# Patient Record
Sex: Female | Born: 2000 | Race: Black or African American | Hispanic: No | Marital: Single | Smoking: Never smoker
Health system: Southern US, Community
[De-identification: ages and names within clinical notes are randomized; demographics above are authoritative.]

## PROBLEM LIST (undated history)

## (undated) ENCOUNTER — Ambulatory Visit (HOSPITAL_COMMUNITY): Payer: BC Managed Care – PPO

## (undated) DIAGNOSIS — I639 Cerebral infarction, unspecified: Secondary | ICD-10-CM

## (undated) DIAGNOSIS — Q200 Common arterial trunk: Secondary | ICD-10-CM

## (undated) HISTORY — PX: AORTIC VALVE REPLACEMENT: SHX41

## (undated) HISTORY — PX: TRUNCUS ARTERIOSUS REPAIR: SHX2583

## (undated) HISTORY — PX: CARDIAC DEFIBRILLATOR PLACEMENT: SHX171

## (undated) HISTORY — DX: Cerebral infarction, unspecified: I63.9

## (undated) HISTORY — PX: CARDIAC VALVE REPLACEMENT: SHX585

---
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
## 2022-01-30 DIAGNOSIS — F411 Generalized anxiety disorder: Secondary | ICD-10-CM | POA: Diagnosis not present

## 2022-01-30 DIAGNOSIS — F330 Major depressive disorder, recurrent, mild: Secondary | ICD-10-CM | POA: Diagnosis not present

## 2022-02-03 DIAGNOSIS — Z86718 Personal history of other venous thrombosis and embolism: Secondary | ICD-10-CM | POA: Diagnosis not present

## 2022-02-03 DIAGNOSIS — Z952 Presence of prosthetic heart valve: Secondary | ICD-10-CM | POA: Diagnosis not present

## 2022-02-10 DIAGNOSIS — Z7901 Long term (current) use of anticoagulants: Secondary | ICD-10-CM | POA: Diagnosis not present

## 2022-02-10 DIAGNOSIS — R2241 Localized swelling, mass and lump, right lower limb: Secondary | ICD-10-CM | POA: Diagnosis not present

## 2022-02-10 DIAGNOSIS — Z7982 Long term (current) use of aspirin: Secondary | ICD-10-CM | POA: Diagnosis not present

## 2022-02-10 DIAGNOSIS — D1723 Benign lipomatous neoplasm of skin and subcutaneous tissue of right leg: Secondary | ICD-10-CM | POA: Diagnosis not present

## 2022-02-19 DIAGNOSIS — D1723 Benign lipomatous neoplasm of skin and subcutaneous tissue of right leg: Secondary | ICD-10-CM | POA: Diagnosis not present

## 2022-03-05 DIAGNOSIS — F411 Generalized anxiety disorder: Secondary | ICD-10-CM | POA: Diagnosis not present

## 2022-03-05 DIAGNOSIS — F330 Major depressive disorder, recurrent, mild: Secondary | ICD-10-CM | POA: Diagnosis not present

## 2022-03-10 IMAGING — CT CT ABD-PELV W/O CM
2 of 4 series · 14 of 46 positions shown, 16 images · non-contrast
Comparison: Pelvic ultrasound, dated October 07, 2020
COMPARISON: Pelvic ultrasound, dated October 07, 2020

Addendum:
CLINICAL DATA: Abdominal pain and vomiting.

EXAM:
CT ABDOMEN AND PELVIS WITHOUT CONTRAST
TECHNIQUE: Multidetector CT imaging of the abdomen and pelvis was performed
following the standard protocol without IV contrast.

[Series 3: abd/ pelvis 5.0 i30f 2 · axial · 0.59mm/px · z∈[+906,+1251]mm · 11 of 81 slices shown, 13 images]
[im 6/81  soft-tissue]
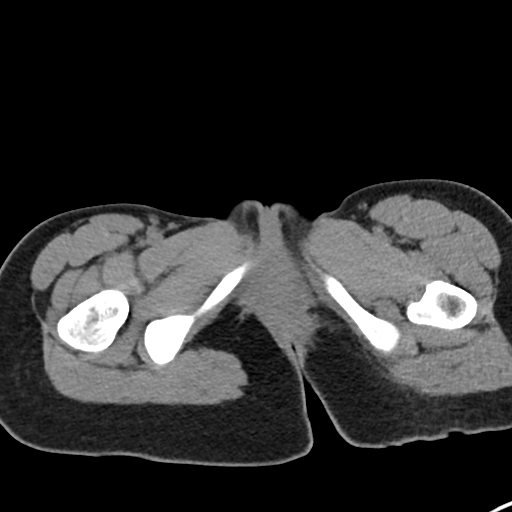
[im 6/81  bone]
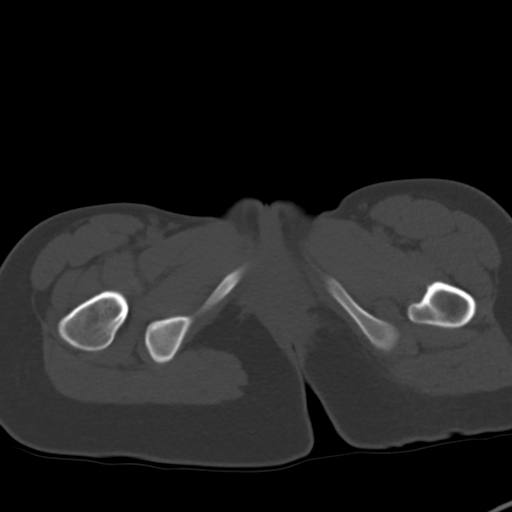
[im 12/81  soft-tissue]
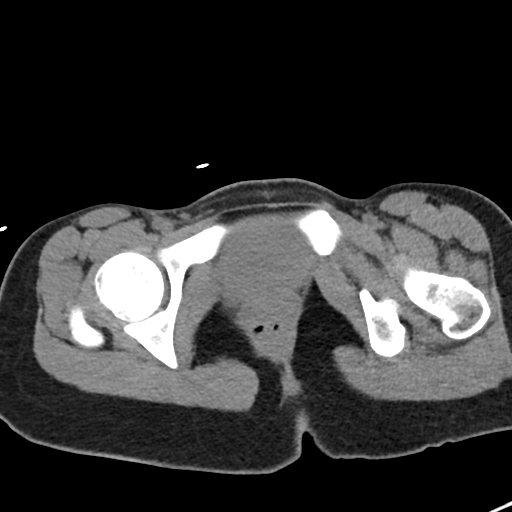
[im 18/81  soft-tissue]
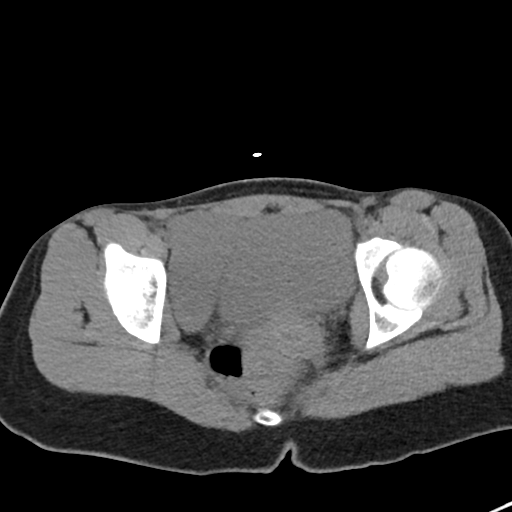
[im 27/81  soft-tissue]
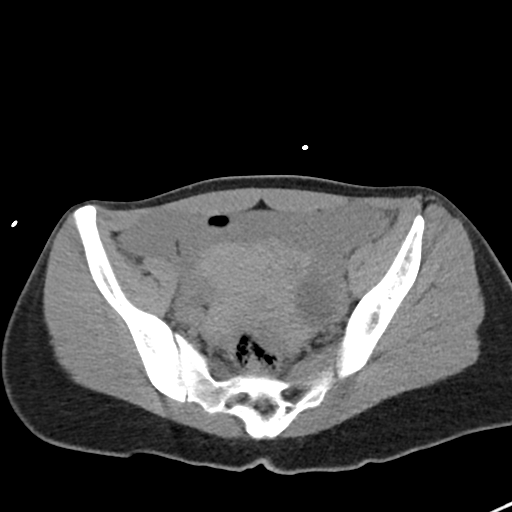
[im 33/81  soft-tissue]
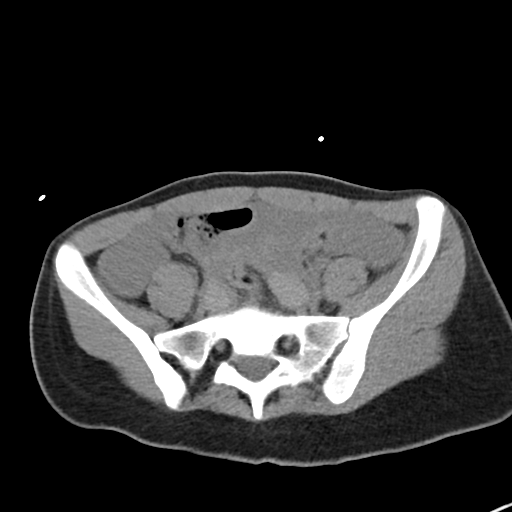
[im 42/81  soft-tissue]
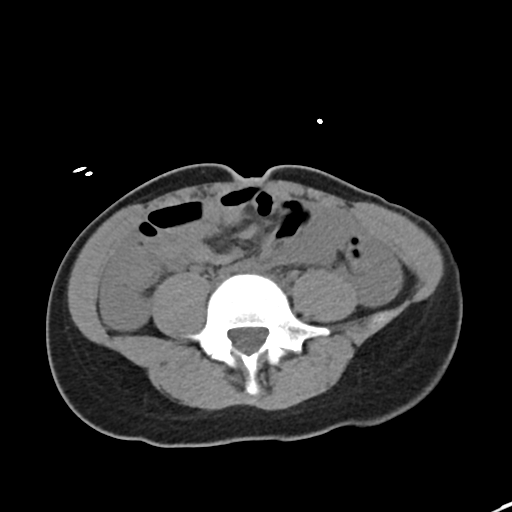
[im 48/81  soft-tissue]
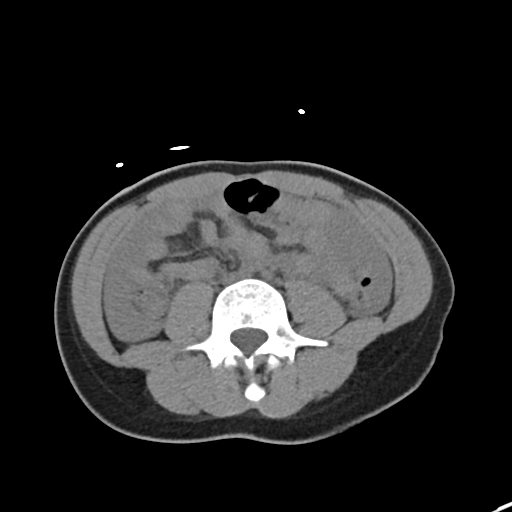
[im 54/81  soft-tissue]
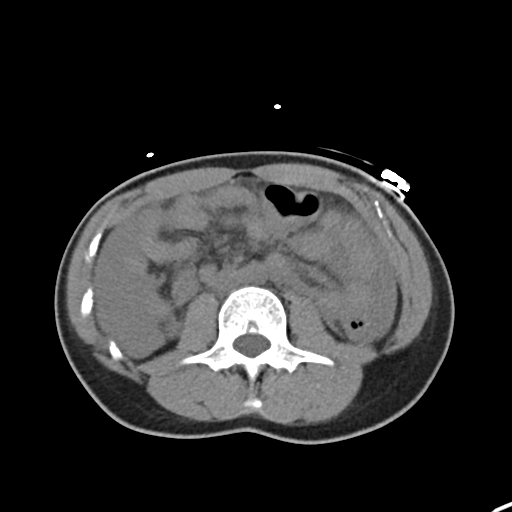
[im 63/81  soft-tissue]
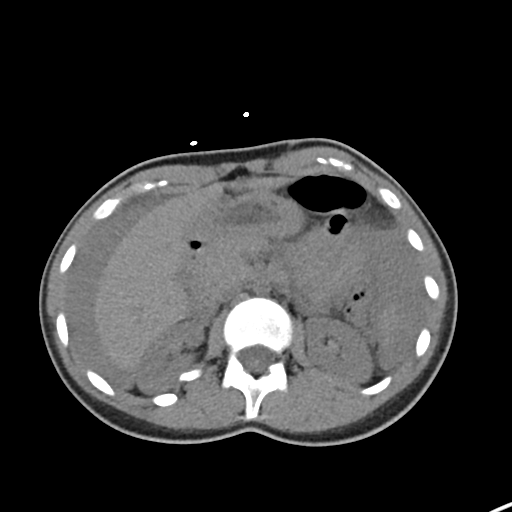
[im 63/81  bone]
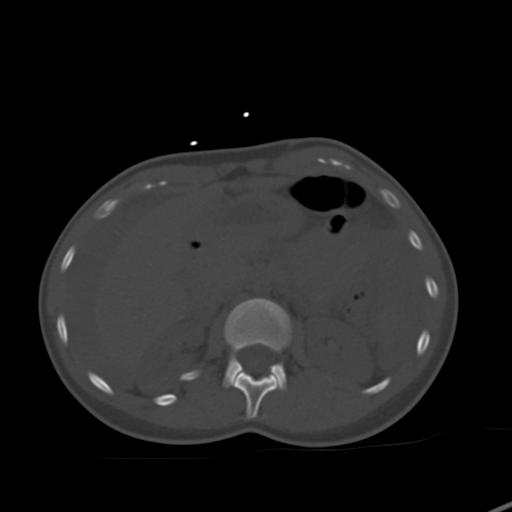
[im 69/81  soft-tissue]
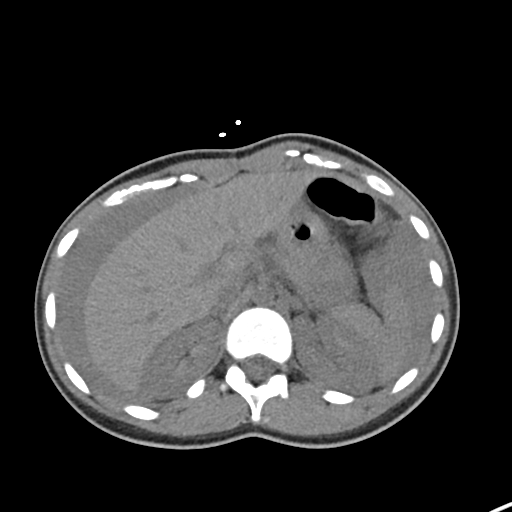
[im 75/81  soft-tissue]
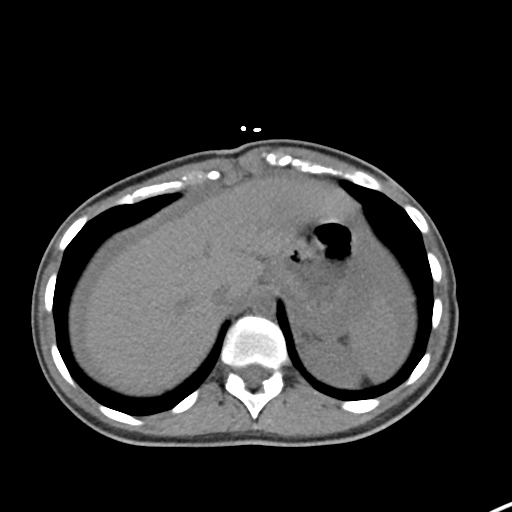

[Series 5: cor st · coronal · 0.59mm/px · 3 of 71 slices shown]
[im 24/71  soft-tissue]
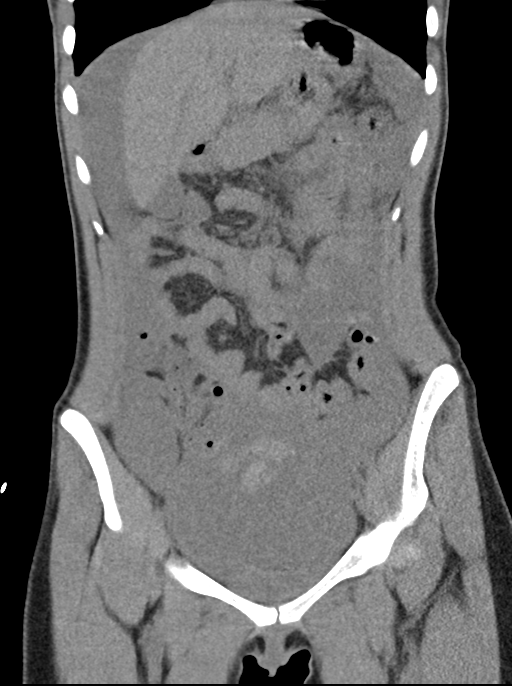
[im 32/71  soft-tissue]
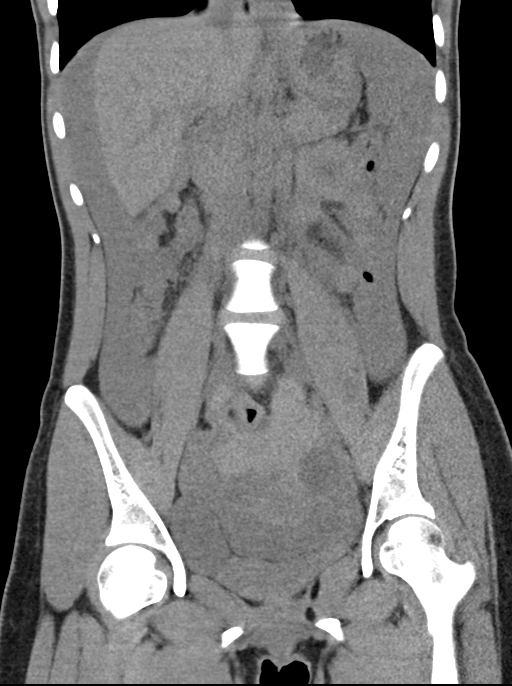
[im 39/71  soft-tissue]
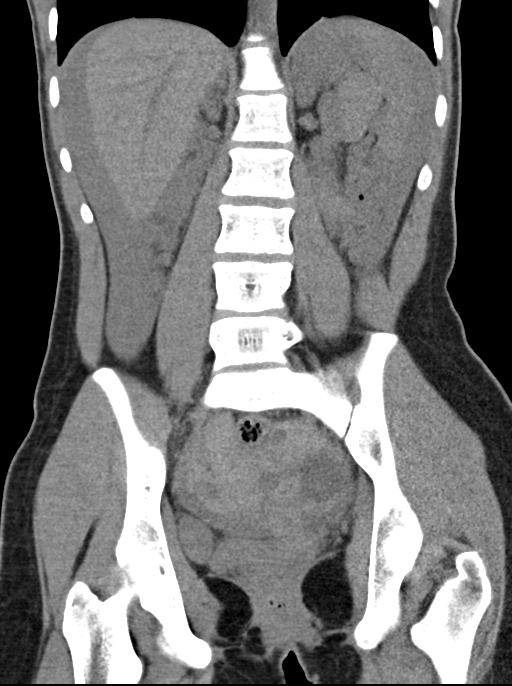

[14 of 46 positions shown; findings below may reference images not displayed]

FINDINGS: Lower chest: No acute abnormality.

Hepatobiliary: No focal liver abnormality is seen. No gallstones,
gallbladder wall thickening, or biliary dilatation.

Pancreas: Unremarkable. No pancreatic ductal dilatation or
surrounding inflammatory changes.

Spleen: Normal in size without focal abnormality.

Adrenals/Urinary Tract: Adrenal glands are unremarkable. Kidneys are
normal, without renal calculi, focal lesion, or hydronephrosis.
Bladder is unremarkable.

Stomach/Bowel: Stomach is within normal limits. The appendix is not
identified. No evidence of bowel wall thickening, distention, or
inflammatory changes.

Vascular/Lymphatic: Aortic atherosclerosis. No enlarged abdominal or
pelvic lymph nodes.

Reproductive: The uterus is poorly visualized. A 3.7 cm x 2.8 cm
cystic appearing area is noted within the left adnexa.

A 5.7 cm x 6.5 cm area of heterogeneous increased attenuation is
noted within the posterior aspect of the mid and lower pelvis.

Other: No abdominal wall hernia or abnormality. A marked amount of
abdominal and pelvic free fluid is noted.

Musculoskeletal: No acute or significant osseous findings.
IMPRESSION: 1. Findings worrisome for acute blood products within the pelvis.
Sequelae associated with a ruptured hemorrhagic cyst, ruptured
tubo-ovarian abscess or ruptured ectopic pregnancy cannot be
excluded. Correlation with pelvic MRI as well as serial beta HCG
levels is recommended.
2. Left adnexal cyst which corresponds to the left ovarian cyst seen
on the prior pelvic ultrasound.

ADDENDUM:
After further discussion with the ordering physician, absent sexual
activity of the patient was disclosed which would exclude the
presence of a ruptured ectopic pregnancy as part of the differential
diagnosis. Gynecologic origin of the acute pelvic blood is
suspected. As result, gynecologic consult is recommended.

*** End of Addendum ***
FINDINGS: Lower chest: No acute abnormality.

Hepatobiliary: No focal liver abnormality is seen. No gallstones,
gallbladder wall thickening, or biliary dilatation.

Pancreas: Unremarkable. No pancreatic ductal dilatation or
surrounding inflammatory changes.

Spleen: Normal in size without focal abnormality.

Adrenals/Urinary Tract: Adrenal glands are unremarkable. Kidneys are
normal, without renal calculi, focal lesion, or hydronephrosis.
Bladder is unremarkable.

Stomach/Bowel: Stomach is within normal limits. The appendix is not
identified. No evidence of bowel wall thickening, distention, or
inflammatory changes.

Vascular/Lymphatic: Aortic atherosclerosis. No enlarged abdominal or
pelvic lymph nodes.

Reproductive: The uterus is poorly visualized. A 3.7 cm x 2.8 cm
cystic appearing area is noted within the left adnexa.

A 5.7 cm x 6.5 cm area of heterogeneous increased attenuation is
noted within the posterior aspect of the mid and lower pelvis.

Other: No abdominal wall hernia or abnormality. A marked amount of
abdominal and pelvic free fluid is noted.

Musculoskeletal: No acute or significant osseous findings.
IMPRESSION: 1. Findings worrisome for acute blood products within the pelvis.
Sequelae associated with a ruptured hemorrhagic cyst, ruptured
tubo-ovarian abscess or ruptured ectopic pregnancy cannot be
excluded. Correlation with pelvic MRI as well as serial beta HCG
levels is recommended.
2. Left adnexal cyst which corresponds to the left ovarian cyst seen
on the prior pelvic ultrasound.

## 2022-04-02 DIAGNOSIS — F330 Major depressive disorder, recurrent, mild: Secondary | ICD-10-CM | POA: Diagnosis not present

## 2022-04-02 DIAGNOSIS — F411 Generalized anxiety disorder: Secondary | ICD-10-CM | POA: Diagnosis not present

## 2022-06-18 DIAGNOSIS — Q200 Common arterial trunk: Secondary | ICD-10-CM | POA: Diagnosis not present

## 2022-06-18 DIAGNOSIS — Z95810 Presence of automatic (implantable) cardiac defibrillator: Secondary | ICD-10-CM | POA: Diagnosis not present

## 2022-07-20 DIAGNOSIS — Z86718 Personal history of other venous thrombosis and embolism: Secondary | ICD-10-CM | POA: Diagnosis not present

## 2022-07-20 DIAGNOSIS — Z952 Presence of prosthetic heart valve: Secondary | ICD-10-CM | POA: Diagnosis not present

## 2022-07-31 DIAGNOSIS — Z4502 Encounter for adjustment and management of automatic implantable cardiac defibrillator: Secondary | ICD-10-CM | POA: Diagnosis not present

## 2022-07-31 DIAGNOSIS — Z95810 Presence of automatic (implantable) cardiac defibrillator: Secondary | ICD-10-CM | POA: Diagnosis not present

## 2022-08-17 DIAGNOSIS — Z86718 Personal history of other venous thrombosis and embolism: Secondary | ICD-10-CM | POA: Diagnosis not present

## 2022-08-17 DIAGNOSIS — Z952 Presence of prosthetic heart valve: Secondary | ICD-10-CM | POA: Diagnosis not present

## 2022-08-25 DIAGNOSIS — Q210 Ventricular septal defect: Secondary | ICD-10-CM | POA: Diagnosis not present

## 2022-08-25 DIAGNOSIS — Z952 Presence of prosthetic heart valve: Secondary | ICD-10-CM | POA: Diagnosis not present

## 2022-08-25 DIAGNOSIS — Z954 Presence of other heart-valve replacement: Secondary | ICD-10-CM | POA: Diagnosis not present

## 2022-08-25 DIAGNOSIS — Z950 Presence of cardiac pacemaker: Secondary | ICD-10-CM | POA: Diagnosis not present

## 2022-08-25 DIAGNOSIS — Z8774 Personal history of (corrected) congenital malformations of heart and circulatory system: Secondary | ICD-10-CM | POA: Diagnosis not present

## 2022-08-25 DIAGNOSIS — Q200 Common arterial trunk: Secondary | ICD-10-CM | POA: Diagnosis not present

## 2022-09-14 DIAGNOSIS — Z86718 Personal history of other venous thrombosis and embolism: Secondary | ICD-10-CM | POA: Diagnosis not present

## 2022-09-14 DIAGNOSIS — Z952 Presence of prosthetic heart valve: Secondary | ICD-10-CM | POA: Diagnosis not present

## 2022-10-17 DIAGNOSIS — Z95810 Presence of automatic (implantable) cardiac defibrillator: Secondary | ICD-10-CM | POA: Diagnosis not present

## 2022-10-17 DIAGNOSIS — R109 Unspecified abdominal pain: Secondary | ICD-10-CM | POA: Diagnosis not present

## 2022-10-17 DIAGNOSIS — D62 Acute posthemorrhagic anemia: Secondary | ICD-10-CM | POA: Diagnosis not present

## 2022-10-17 DIAGNOSIS — Z7982 Long term (current) use of aspirin: Secondary | ICD-10-CM | POA: Diagnosis not present

## 2022-10-17 DIAGNOSIS — R079 Chest pain, unspecified: Secondary | ICD-10-CM | POA: Diagnosis not present

## 2022-10-17 DIAGNOSIS — R9431 Abnormal electrocardiogram [ECG] [EKG]: Secondary | ICD-10-CM | POA: Diagnosis not present

## 2022-10-17 DIAGNOSIS — Z8673 Personal history of transient ischemic attack (TIA), and cerebral infarction without residual deficits: Secondary | ICD-10-CM | POA: Diagnosis not present

## 2022-10-17 DIAGNOSIS — Z952 Presence of prosthetic heart valve: Secondary | ICD-10-CM | POA: Diagnosis not present

## 2022-10-17 DIAGNOSIS — R791 Abnormal coagulation profile: Secondary | ICD-10-CM | POA: Diagnosis not present

## 2022-10-17 DIAGNOSIS — N939 Abnormal uterine and vaginal bleeding, unspecified: Secondary | ICD-10-CM | POA: Diagnosis not present

## 2022-10-17 DIAGNOSIS — D6859 Other primary thrombophilia: Secondary | ICD-10-CM | POA: Diagnosis not present

## 2022-10-17 DIAGNOSIS — G43909 Migraine, unspecified, not intractable, without status migrainosus: Secondary | ICD-10-CM | POA: Diagnosis not present

## 2022-10-17 DIAGNOSIS — Z7901 Long term (current) use of anticoagulants: Secondary | ICD-10-CM | POA: Diagnosis not present

## 2022-10-17 DIAGNOSIS — K769 Liver disease, unspecified: Secondary | ICD-10-CM | POA: Diagnosis not present

## 2022-10-17 DIAGNOSIS — R110 Nausea: Secondary | ICD-10-CM | POA: Diagnosis not present

## 2022-10-17 DIAGNOSIS — M549 Dorsalgia, unspecified: Secondary | ICD-10-CM | POA: Diagnosis not present

## 2022-10-18 DIAGNOSIS — N939 Abnormal uterine and vaginal bleeding, unspecified: Secondary | ICD-10-CM | POA: Diagnosis not present

## 2022-10-19 DIAGNOSIS — N939 Abnormal uterine and vaginal bleeding, unspecified: Secondary | ICD-10-CM | POA: Diagnosis not present

## 2022-10-21 DIAGNOSIS — I359 Nonrheumatic aortic valve disorder, unspecified: Secondary | ICD-10-CM | POA: Diagnosis not present

## 2022-10-21 DIAGNOSIS — Z952 Presence of prosthetic heart valve: Secondary | ICD-10-CM | POA: Diagnosis not present

## 2022-10-26 DIAGNOSIS — Z952 Presence of prosthetic heart valve: Secondary | ICD-10-CM | POA: Diagnosis not present

## 2022-10-26 DIAGNOSIS — I359 Nonrheumatic aortic valve disorder, unspecified: Secondary | ICD-10-CM | POA: Diagnosis not present

## 2022-10-30 ENCOUNTER — Emergency Department (HOSPITAL_COMMUNITY)
Admission: EM | Admit: 2022-10-30 | Discharge: 2022-10-31 | Disposition: A | Discharge status: Home / Self Care | Payer: BC Managed Care – PPO | Attending: Emergency Medicine | Admitting: Emergency Medicine

## 2022-10-30 ENCOUNTER — Other Ambulatory Visit: Payer: Self-pay

## 2022-10-30 DIAGNOSIS — M79652 Pain in left thigh: Secondary | ICD-10-CM | POA: Diagnosis not present

## 2022-10-30 DIAGNOSIS — D649 Anemia, unspecified: Secondary | ICD-10-CM | POA: Diagnosis not present

## 2022-10-30 DIAGNOSIS — Z5321 Procedure and treatment not carried out due to patient leaving prior to being seen by health care provider: Secondary | ICD-10-CM | POA: Insufficient documentation

## 2022-10-30 NOTE — ED Triage Notes (Signed)
Patient reports L anterior thigh pain x 1 week that started while she was in the hospital at [facility] for anemia. Pain worse with movement and palpation. Mother with patient states pain is probably from a fall she had 2 weeks ago where patient still has a bruise to that leg.

## 2022-10-30 NOTE — ED Notes (Signed)
LWBS 

## 2022-11-02 ENCOUNTER — Encounter (HOSPITAL_COMMUNITY): Payer: Self-pay

## 2022-11-02 ENCOUNTER — Emergency Department (HOSPITAL_COMMUNITY): Payer: BC Managed Care – PPO

## 2022-11-02 ENCOUNTER — Emergency Department (HOSPITAL_BASED_OUTPATIENT_CLINIC_OR_DEPARTMENT_OTHER): Payer: BC Managed Care – PPO

## 2022-11-02 ENCOUNTER — Emergency Department (HOSPITAL_COMMUNITY)
Admission: EM | Admit: 2022-11-02 | Discharge: 2022-11-02 | Disposition: A | Discharge status: Home / Self Care | Payer: BC Managed Care – PPO | Attending: Emergency Medicine | Admitting: Emergency Medicine

## 2022-11-02 DIAGNOSIS — M79662 Pain in left lower leg: Secondary | ICD-10-CM | POA: Diagnosis not present

## 2022-11-02 DIAGNOSIS — M79652 Pain in left thigh: Secondary | ICD-10-CM | POA: Diagnosis not present

## 2022-11-02 DIAGNOSIS — M79605 Pain in left leg: Secondary | ICD-10-CM | POA: Diagnosis not present

## 2022-11-02 DIAGNOSIS — Z7982 Long term (current) use of aspirin: Secondary | ICD-10-CM | POA: Insufficient documentation

## 2022-11-02 DIAGNOSIS — Z7901 Long term (current) use of anticoagulants: Secondary | ICD-10-CM | POA: Diagnosis not present

## 2022-11-02 NOTE — ED Provider Notes (Signed)
[place] EMERGENCY DEPARTMENT AT [facility] Provider Note   CSN: [id number] Arrival date & time: 11/02/22  1111     History Chief Complaint  Patient presents with   Leg Pain    Peggy Bennett is a 22 y.o. female.  Patient patient presents to the emergency department complaints of leg pain.  She reports this pain has been present for the last several weeks.  Was recently started on birth control while she was at [facility].  Has previously had a stroke and is currently on warfarin.  Denies any significant or notable leg swelling but does report that the pain is persistently in her left anterior thigh.  Denies any chest pain or shortness of breath.   Leg Pain      Home Medications Prior to Admission medications   Medication Sig Start Date End Date Taking? Authorizing Provider  aspirin 81 MG chewable tablet Chew 81 mg by mouth daily.    [provider]  hydrocortisone (ANUSOL-HC) 25 MG suppository Place 1 suppository (25 mg total) rectally 2 (two) times daily. Patient not taking: Reported on 11/06/2020 10/06/20   Elpidio Anis, PA-C  omeprazole (PRILOSEC) 20 MG capsule Take 20 mg by mouth daily as needed (heartburn). Patient not taking: Reported on 11/06/2020    [provider]  polyethylene glycol (MIRALAX / GLYCOLAX) 17 g packet Take 17 g by mouth daily as needed for mild constipation. Patient not taking: Reported on 11/06/2020    [provider]  sotalol (BETAPACE) 80 MG tablet Take 40 mg by mouth 2 (two) times daily.  06/28/19   [provider]  warfarin (COUMADIN) 1 MG tablet Take 7 mg by mouth daily. 05/24/19   [provider]      Allergies    Patient has no known allergies.    Review of Systems   Review of Systems  Cardiovascular:  Negative for leg swelling.  Musculoskeletal:        Left thigh pain  All other systems reviewed and are negative.   Physical Exam Updated Vital Signs BP (!) 120/56 (BP Location: Right Arm)    Pulse 62   Temp 98.2 F (36.8 C)   Resp 15   Ht 5\' 4"  (1.626 m)   Wt 49.9 kg   LMP 10/16/2022   SpO2 100%   BMI 18.88 kg/m  Physical Exam Vitals and nursing note reviewed.  Constitutional:      General: She is not in acute distress.    Appearance: She is well-developed.  HENT:     Head: Normocephalic and atraumatic.  Eyes:     Conjunctiva/sclera: Conjunctivae normal.  Cardiovascular:     Rate and Rhythm: Normal rate and regular rhythm.     Heart sounds: No murmur heard. Pulmonary:     Effort: Pulmonary effort is normal. No respiratory distress.     Breath sounds: Normal breath sounds.  Abdominal:     Palpations: Abdomen is soft.     Tenderness: There is no abdominal tenderness.  Musculoskeletal:        General: Tenderness present. No swelling, deformity or signs of injury.     Cervical back: Neck supple.     Right lower leg: No edema.     Left lower leg: No edema.     Comments: Tenderness to palpation of the left anterior thigh  Skin:    General: Skin is warm and dry.     Capillary Refill: Capillary refill takes less than 2 seconds.  Neurological:     Mental Status: She is alert.  Psychiatric:        Mood and Affect: Mood normal.     ED Results / Procedures / Treatments   Labs (all labs ordered are listed, but only abnormal results are displayed) Labs Reviewed - No data to display  EKG None  Radiology VAS [place] LOWER EXTREMITY VENOUS (DVT) (7a-7p)  Result Date: 11/02/2022  Lower Venous DVT Study Patient Name:  Peggy Bennett  Date of Exam:   11/02/2022 Medical Rec #: [id number]     Accession #:    [id number] Date of Birth: April 03, 2001     Patient Gender: F Patient Age:   35 years Exam Location:  [facility] Procedure:      VAS [place] LOWER EXTREMITY VENOUS (DVT) Referring Phys: Maryanna Shape --------------------------------------------------------------------------------  Indications: Left thigh pain.  Anticoagulation: Warfarin. Comparison Study: No previous study.  Performing Technologist: McKayla Maag RVT, VT  Examination Guidelines: A complete evaluation includes B-mode imaging, spectral Doppler, color Doppler, and power Doppler as needed of all accessible portions of each vessel. Bilateral testing is considered an integral part of a complete examination. Limited examinations for reoccurring indications may be performed as noted. The reflux portion of the exam is performed with the patient in reverse Trendelenburg.  +-----+---------------+---------+-----------+----------+--------------+ RIGHTCompressibilityPhasicitySpontaneityPropertiesThrombus Aging +-----+---------------+---------+-----------+----------+--------------+ CFV  Full           Yes      Yes                                 +-----+---------------+---------+-----------+----------+--------------+ SFJ  Full                                                        +-----+---------------+---------+-----------+----------+--------------+   +---------+---------------+---------+-----------+----------+--------------+ LEFT     CompressibilityPhasicitySpontaneityPropertiesThrombus Aging +---------+---------------+---------+-----------+----------+--------------+ CFV      Full           Yes      Yes                                 +---------+---------------+---------+-----------+----------+--------------+ SFJ      Full                                                        +---------+---------------+---------+-----------+----------+--------------+ FV Prox  Full                                                        +---------+---------------+---------+-----------+----------+--------------+ FV Mid   Full                                                        +---------+---------------+---------+-----------+----------+--------------+ FV DistalFull                                                         +---------+---------------+---------+-----------+----------+--------------+  PFV      Full                                                        +---------+---------------+---------+-----------+----------+--------------+ POP      Full           Yes      Yes                                 +---------+---------------+---------+-----------+----------+--------------+ PTV      Full                                                        +---------+---------------+---------+-----------+----------+--------------+ PERO     Full                                                        +---------+---------------+---------+-----------+----------+--------------+    Summary: RIGHT: - No evidence of common femoral vein obstruction.  LEFT: - There is no evidence of deep vein thrombosis in the lower extremity.  - No cystic structure found in the popliteal fossa.  *See table(s) above for measurements and observations.    Preliminary    DG Femur Min 2 Views Left  Result Date: 11/02/2022 CLINICAL DATA:  Leg pain for the past 3 days. EXAM: LEFT FEMUR 2 VIEWS COMPARISON:  None Available. FINDINGS: There is no evidence of fracture or other focal bone lesions. Soft tissues are unremarkable. IMPRESSION: Negative. Electronically Signed   By: Obie Dredge M.D.   On: 11/02/2022 13:37    Procedures Procedures   Medications Ordered in ED Medications - No data to display  ED Course/ Medical Decision Making/ A&P                           Medical Decision Making Amount and/or Complexity of Data Reviewed Radiology: ordered.   This patient presents to the ED for concern of leg pain.  Differential diagnosis includes DVT, hematoma, femur fracture, ankle dislocation   Imaging Studies ordered:  I ordered imaging studies including ultrasound lower extremity, x-ray of left femur I independently visualized and interpreted imaging which showed no evident DVT, no acute bony abnormality I agree with the  radiologist interpretation   Problem List / ED Course:  Patient presents to the emergency department complaints of left thigh pain.  Reports this pain is been present for several weeks and she was discharged from [facility].  Reports that she was started on birth control at that time.  Prior history of stroke and currently on warfarin.  Reports that she has not had any significant symptomatic improvement with symptoms since. Xray imaging ordered from triage without acute abnormalities noted. Will further evaluate with [place]. Ultrasound negative for DVT.  Advised patient to continue with oral contraceptive and oral anticoagulant.  Encourage patient to follow with primary care provider for further evaluation.  Also advised patient return  to the emergency department if any acute worsening symptoms occur.  Patient is agreeable treatment plan verbalized understanding all return precautions.   Final Clinical Impression(s) / ED Diagnoses Final diagnoses:  Left thigh pain    Rx / DC Orders ED Discharge Orders     None         Salomon Mast 11/02/22 1543    Rondel Baton, MD 11/05/22 1146

## 2022-11-02 NOTE — ED Triage Notes (Signed)
Pt states c/o pain on her left thigh and behind her right knee. Pt states she was put on birth control when in the hospital at [facility], d/c'd June 1. Pt was told to come in by the discharge nurse line. Denies injury/trauma, besides falling down a flight of stairs around May 23, but that pain is on the side of her thigh, not anteriorly like this pain.  Last tylenol at 0800, with relief.

## 2022-11-02 NOTE — Progress Notes (Signed)
Left lower extremity venous study completed.   Preliminary results relayed to PA in ER.  Please see CV Procedures for preliminary results.  Cole Klugh, RVT  3:28 PM 11/02/22

## 2022-11-02 NOTE — Discharge Instructions (Signed)
You were seen in the ED for leg pain. Your xray and ultrasound were negative for any acute bone abnormality or evidence of a DVT. Please follow up with your PCP for further evaluation. Please return to the ER if your symptoms are worsening.

## 2022-11-09 DIAGNOSIS — N939 Abnormal uterine and vaginal bleeding, unspecified: Secondary | ICD-10-CM | POA: Diagnosis not present

## 2022-11-11 DIAGNOSIS — D72819 Decreased white blood cell count, unspecified: Secondary | ICD-10-CM | POA: Diagnosis not present

## 2022-11-19 DIAGNOSIS — Z952 Presence of prosthetic heart valve: Secondary | ICD-10-CM | POA: Diagnosis not present

## 2022-11-19 DIAGNOSIS — Z86718 Personal history of other venous thrombosis and embolism: Secondary | ICD-10-CM | POA: Diagnosis not present

## 2022-11-23 DIAGNOSIS — F341 Dysthymic disorder: Secondary | ICD-10-CM | POA: Diagnosis not present

## 2022-11-23 DIAGNOSIS — F411 Generalized anxiety disorder: Secondary | ICD-10-CM | POA: Diagnosis not present

## 2022-12-02 DIAGNOSIS — F419 Anxiety disorder, unspecified: Secondary | ICD-10-CM | POA: Diagnosis not present

## 2022-12-02 DIAGNOSIS — F341 Dysthymic disorder: Secondary | ICD-10-CM | POA: Diagnosis not present

## 2022-12-02 DIAGNOSIS — Z95810 Presence of automatic (implantable) cardiac defibrillator: Secondary | ICD-10-CM | POA: Diagnosis not present

## 2022-12-10 DIAGNOSIS — K769 Liver disease, unspecified: Secondary | ICD-10-CM | POA: Diagnosis not present

## 2022-12-11 DIAGNOSIS — F341 Dysthymic disorder: Secondary | ICD-10-CM | POA: Diagnosis not present

## 2022-12-11 DIAGNOSIS — F411 Generalized anxiety disorder: Secondary | ICD-10-CM | POA: Diagnosis not present

## 2022-12-17 DIAGNOSIS — Z13220 Encounter for screening for lipoid disorders: Secondary | ICD-10-CM | POA: Diagnosis not present

## 2022-12-17 DIAGNOSIS — E559 Vitamin D deficiency, unspecified: Secondary | ICD-10-CM | POA: Diagnosis not present

## 2022-12-17 DIAGNOSIS — Z86718 Personal history of other venous thrombosis and embolism: Secondary | ICD-10-CM | POA: Diagnosis not present

## 2022-12-17 DIAGNOSIS — Z0000 Encounter for general adult medical examination without abnormal findings: Secondary | ICD-10-CM | POA: Diagnosis not present

## 2022-12-17 DIAGNOSIS — Z1329 Encounter for screening for other suspected endocrine disorder: Secondary | ICD-10-CM | POA: Diagnosis not present

## 2022-12-17 DIAGNOSIS — Z952 Presence of prosthetic heart valve: Secondary | ICD-10-CM | POA: Diagnosis not present

## 2022-12-18 DIAGNOSIS — F411 Generalized anxiety disorder: Secondary | ICD-10-CM | POA: Diagnosis not present

## 2022-12-18 DIAGNOSIS — F341 Dysthymic disorder: Secondary | ICD-10-CM | POA: Diagnosis not present

## 2022-12-22 DIAGNOSIS — F419 Anxiety disorder, unspecified: Secondary | ICD-10-CM | POA: Diagnosis not present

## 2022-12-22 DIAGNOSIS — F341 Dysthymic disorder: Secondary | ICD-10-CM | POA: Diagnosis not present

## 2022-12-30 DIAGNOSIS — F411 Generalized anxiety disorder: Secondary | ICD-10-CM | POA: Diagnosis not present

## 2022-12-30 DIAGNOSIS — F341 Dysthymic disorder: Secondary | ICD-10-CM | POA: Diagnosis not present

## 2022-12-31 DIAGNOSIS — K769 Liver disease, unspecified: Secondary | ICD-10-CM | POA: Diagnosis not present

## 2023-01-14 DIAGNOSIS — Z86718 Personal history of other venous thrombosis and embolism: Secondary | ICD-10-CM | POA: Diagnosis not present

## 2023-01-14 DIAGNOSIS — Z952 Presence of prosthetic heart valve: Secondary | ICD-10-CM | POA: Diagnosis not present

## 2023-01-15 DIAGNOSIS — F411 Generalized anxiety disorder: Secondary | ICD-10-CM | POA: Diagnosis not present

## 2023-01-15 DIAGNOSIS — F341 Dysthymic disorder: Secondary | ICD-10-CM | POA: Diagnosis not present

## 2023-01-18 DIAGNOSIS — Z1329 Encounter for screening for other suspected endocrine disorder: Secondary | ICD-10-CM | POA: Diagnosis not present

## 2023-01-18 DIAGNOSIS — Z1159 Encounter for screening for other viral diseases: Secondary | ICD-10-CM | POA: Diagnosis not present

## 2023-01-18 DIAGNOSIS — Z0000 Encounter for general adult medical examination without abnormal findings: Secondary | ICD-10-CM | POA: Diagnosis not present

## 2023-01-18 DIAGNOSIS — E559 Vitamin D deficiency, unspecified: Secondary | ICD-10-CM | POA: Diagnosis not present

## 2023-01-22 DIAGNOSIS — F419 Anxiety disorder, unspecified: Secondary | ICD-10-CM | POA: Diagnosis not present

## 2023-01-22 DIAGNOSIS — F341 Dysthymic disorder: Secondary | ICD-10-CM | POA: Diagnosis not present

## 2023-01-29 DIAGNOSIS — F411 Generalized anxiety disorder: Secondary | ICD-10-CM | POA: Diagnosis not present

## 2023-01-29 DIAGNOSIS — F341 Dysthymic disorder: Secondary | ICD-10-CM | POA: Diagnosis not present

## 2023-02-19 DIAGNOSIS — F411 Generalized anxiety disorder: Secondary | ICD-10-CM | POA: Diagnosis not present

## 2023-02-19 DIAGNOSIS — F341 Dysthymic disorder: Secondary | ICD-10-CM | POA: Diagnosis not present

## 2023-02-26 DIAGNOSIS — F419 Anxiety disorder, unspecified: Secondary | ICD-10-CM | POA: Diagnosis not present

## 2023-02-26 DIAGNOSIS — F341 Dysthymic disorder: Secondary | ICD-10-CM | POA: Diagnosis not present

## 2023-03-19 DIAGNOSIS — F341 Dysthymic disorder: Secondary | ICD-10-CM | POA: Diagnosis not present

## 2023-03-19 DIAGNOSIS — F411 Generalized anxiety disorder: Secondary | ICD-10-CM | POA: Diagnosis not present

## 2023-03-30 DIAGNOSIS — Z95810 Presence of automatic (implantable) cardiac defibrillator: Secondary | ICD-10-CM | POA: Diagnosis not present

## 2023-04-02 DIAGNOSIS — Z952 Presence of prosthetic heart valve: Secondary | ICD-10-CM | POA: Diagnosis not present

## 2023-04-02 DIAGNOSIS — F411 Generalized anxiety disorder: Secondary | ICD-10-CM | POA: Diagnosis not present

## 2023-04-02 DIAGNOSIS — Z86718 Personal history of other venous thrombosis and embolism: Secondary | ICD-10-CM | POA: Diagnosis not present

## 2023-04-02 DIAGNOSIS — F341 Dysthymic disorder: Secondary | ICD-10-CM | POA: Diagnosis not present

## 2023-04-27 DIAGNOSIS — Z8774 Personal history of (corrected) congenital malformations of heart and circulatory system: Secondary | ICD-10-CM | POA: Diagnosis not present

## 2023-04-27 DIAGNOSIS — Q210 Ventricular septal defect: Secondary | ICD-10-CM | POA: Diagnosis not present

## 2023-04-27 DIAGNOSIS — Q200 Common arterial trunk: Secondary | ICD-10-CM | POA: Diagnosis not present

## 2023-04-27 DIAGNOSIS — I359 Nonrheumatic aortic valve disorder, unspecified: Secondary | ICD-10-CM | POA: Diagnosis not present

## 2023-04-27 DIAGNOSIS — Z952 Presence of prosthetic heart valve: Secondary | ICD-10-CM | POA: Diagnosis not present

## 2023-04-30 DIAGNOSIS — Z952 Presence of prosthetic heart valve: Secondary | ICD-10-CM | POA: Diagnosis not present

## 2023-04-30 DIAGNOSIS — Z86718 Personal history of other venous thrombosis and embolism: Secondary | ICD-10-CM | POA: Diagnosis not present

## 2023-05-07 DIAGNOSIS — F411 Generalized anxiety disorder: Secondary | ICD-10-CM | POA: Diagnosis not present

## 2023-05-07 DIAGNOSIS — F341 Dysthymic disorder: Secondary | ICD-10-CM | POA: Diagnosis not present

## 2023-05-10 DIAGNOSIS — F419 Anxiety disorder, unspecified: Secondary | ICD-10-CM | POA: Diagnosis not present

## 2023-05-10 DIAGNOSIS — F341 Dysthymic disorder: Secondary | ICD-10-CM | POA: Diagnosis not present

## 2023-05-14 DIAGNOSIS — F341 Dysthymic disorder: Secondary | ICD-10-CM | POA: Diagnosis not present

## 2023-05-14 DIAGNOSIS — F411 Generalized anxiety disorder: Secondary | ICD-10-CM | POA: Diagnosis not present

## 2023-05-21 DIAGNOSIS — F411 Generalized anxiety disorder: Secondary | ICD-10-CM | POA: Diagnosis not present

## 2023-05-21 DIAGNOSIS — F341 Dysthymic disorder: Secondary | ICD-10-CM | POA: Diagnosis not present

## 2023-05-28 DIAGNOSIS — Z86718 Personal history of other venous thrombosis and embolism: Secondary | ICD-10-CM | POA: Diagnosis not present

## 2023-05-28 DIAGNOSIS — Z952 Presence of prosthetic heart valve: Secondary | ICD-10-CM | POA: Diagnosis not present

## 2023-06-04 DIAGNOSIS — F411 Generalized anxiety disorder: Secondary | ICD-10-CM | POA: Diagnosis not present

## 2023-06-04 DIAGNOSIS — F341 Dysthymic disorder: Secondary | ICD-10-CM | POA: Diagnosis not present

## 2023-06-07 DIAGNOSIS — F341 Dysthymic disorder: Secondary | ICD-10-CM | POA: Diagnosis not present

## 2023-06-07 DIAGNOSIS — F419 Anxiety disorder, unspecified: Secondary | ICD-10-CM | POA: Diagnosis not present

## 2023-06-11 DIAGNOSIS — F341 Dysthymic disorder: Secondary | ICD-10-CM | POA: Diagnosis not present

## 2023-06-11 DIAGNOSIS — F411 Generalized anxiety disorder: Secondary | ICD-10-CM | POA: Diagnosis not present

## 2023-06-18 DIAGNOSIS — F341 Dysthymic disorder: Secondary | ICD-10-CM | POA: Diagnosis not present

## 2023-06-18 DIAGNOSIS — F411 Generalized anxiety disorder: Secondary | ICD-10-CM | POA: Diagnosis not present

## 2023-06-25 DIAGNOSIS — F341 Dysthymic disorder: Secondary | ICD-10-CM | POA: Diagnosis not present

## 2023-06-25 DIAGNOSIS — F411 Generalized anxiety disorder: Secondary | ICD-10-CM | POA: Diagnosis not present

## 2023-06-29 DIAGNOSIS — Z95810 Presence of automatic (implantable) cardiac defibrillator: Secondary | ICD-10-CM | POA: Diagnosis not present

## 2023-07-23 DIAGNOSIS — F411 Generalized anxiety disorder: Secondary | ICD-10-CM | POA: Diagnosis not present

## 2023-07-23 DIAGNOSIS — F341 Dysthymic disorder: Secondary | ICD-10-CM | POA: Diagnosis not present

## 2023-07-30 DIAGNOSIS — F411 Generalized anxiety disorder: Secondary | ICD-10-CM | POA: Diagnosis not present

## 2023-07-30 DIAGNOSIS — F341 Dysthymic disorder: Secondary | ICD-10-CM | POA: Diagnosis not present

## 2023-08-20 DIAGNOSIS — F411 Generalized anxiety disorder: Secondary | ICD-10-CM | POA: Diagnosis not present

## 2023-08-20 DIAGNOSIS — F341 Dysthymic disorder: Secondary | ICD-10-CM | POA: Diagnosis not present

## 2023-08-27 DIAGNOSIS — F411 Generalized anxiety disorder: Secondary | ICD-10-CM | POA: Diagnosis not present

## 2023-08-27 DIAGNOSIS — F341 Dysthymic disorder: Secondary | ICD-10-CM | POA: Diagnosis not present

## 2023-09-03 DIAGNOSIS — F411 Generalized anxiety disorder: Secondary | ICD-10-CM | POA: Diagnosis not present

## 2023-09-03 DIAGNOSIS — F331 Major depressive disorder, recurrent, moderate: Secondary | ICD-10-CM | POA: Diagnosis not present

## 2023-09-03 DIAGNOSIS — F341 Dysthymic disorder: Secondary | ICD-10-CM | POA: Diagnosis not present

## 2023-09-24 DIAGNOSIS — F419 Anxiety disorder, unspecified: Secondary | ICD-10-CM | POA: Diagnosis not present

## 2023-09-24 DIAGNOSIS — F411 Generalized anxiety disorder: Secondary | ICD-10-CM | POA: Diagnosis not present

## 2023-09-24 DIAGNOSIS — F341 Dysthymic disorder: Secondary | ICD-10-CM | POA: Diagnosis not present

## 2023-09-28 DIAGNOSIS — Z95810 Presence of automatic (implantable) cardiac defibrillator: Secondary | ICD-10-CM | POA: Diagnosis not present

## 2023-09-28 DIAGNOSIS — Z4502 Encounter for adjustment and management of automatic implantable cardiac defibrillator: Secondary | ICD-10-CM | POA: Diagnosis not present

## 2023-09-28 DIAGNOSIS — Z7901 Long term (current) use of anticoagulants: Secondary | ICD-10-CM | POA: Diagnosis not present

## 2023-09-28 DIAGNOSIS — I472 Ventricular tachycardia, unspecified: Secondary | ICD-10-CM | POA: Diagnosis not present

## 2023-10-07 ENCOUNTER — Encounter (HOSPITAL_COMMUNITY): Payer: Self-pay | Admitting: *Deleted

## 2023-10-07 ENCOUNTER — Other Ambulatory Visit: Payer: Self-pay

## 2023-10-07 ENCOUNTER — Emergency Department (HOSPITAL_COMMUNITY): Payer: Self-pay

## 2023-10-07 ENCOUNTER — Emergency Department (HOSPITAL_COMMUNITY)
Admission: EM | Admit: 2023-10-07 | Discharge: 2023-10-07 | Disposition: A | Discharge status: Home / Self Care | Payer: Self-pay | Attending: Emergency Medicine | Admitting: Emergency Medicine

## 2023-10-07 DIAGNOSIS — Z7982 Long term (current) use of aspirin: Secondary | ICD-10-CM | POA: Insufficient documentation

## 2023-10-07 DIAGNOSIS — R1032 Left lower quadrant pain: Secondary | ICD-10-CM

## 2023-10-07 DIAGNOSIS — Z7901 Long term (current) use of anticoagulants: Secondary | ICD-10-CM | POA: Insufficient documentation

## 2023-10-07 DIAGNOSIS — R319 Hematuria, unspecified: Secondary | ICD-10-CM

## 2023-10-07 LAB — URINALYSIS, ROUTINE W REFLEX MICROSCOPIC
Bilirubin Urine: NEGATIVE
Glucose, UA: NEGATIVE mg/dL
Ketones, ur: NEGATIVE mg/dL
Leukocytes,Ua: NEGATIVE
Nitrite: NEGATIVE
Protein, ur: 100 mg/dL — AB
Specific Gravity, Urine: 1.021 (ref 1.005–1.030)
pH: 5 (ref 5.0–8.0)

## 2023-10-07 LAB — COMPREHENSIVE METABOLIC PANEL WITH GFR
ALT: 16 U/L (ref 0–44)
AST: 30 U/L (ref 15–41)
Albumin: 4.3 g/dL (ref 3.5–5.0)
Alkaline Phosphatase: 38 U/L (ref 38–126)
Anion gap: 8 (ref 5–15)
BUN: 9 mg/dL (ref 6–20)
CO2: 22 mmol/L (ref 22–32)
Calcium: 9.2 mg/dL (ref 8.9–10.3)
Chloride: 106 mmol/L (ref 98–111)
Creatinine, Ser: 0.88 mg/dL (ref 0.44–1.00)
GFR, Estimated: >60 mL/min (ref >60)
Glucose, Bld: 90 mg/dL (ref 70–99)
Potassium: 4.1 mmol/L (ref 3.5–5.1)
Sodium: 136 mmol/L (ref 135–145)
Total Bilirubin: 1 mg/dL (ref 0.0–1.2)
Total Protein: 6.8 g/dL (ref 6.5–8.1)

## 2023-10-07 LAB — CBC
HCT: 43.1 % (ref 36.0–46.0)
Hemoglobin: 14.4 g/dL (ref 12.0–15.0)
MCH: 30.7 pg (ref 26.0–34.0)
MCHC: 33.4 g/dL (ref 30.0–36.0)
MCV: 91.9 fL (ref 80.0–100.0)
Platelets: 203 10*3/uL (ref 150–400)
RBC: 4.69 MIL/uL (ref 3.87–5.11)
RDW: 13.2 % (ref 11.5–15.5)
WBC: 5.6 10*3/uL (ref 4.0–10.5)
nRBC: 0 % (ref 0.0–0.2)

## 2023-10-07 LAB — PREGNANCY, URINE: Preg Test, Ur: NEGATIVE

## 2023-10-07 LAB — LIPASE, BLOOD: Lipase: 36 U/L (ref 11–51)

## 2023-10-07 LAB — PROTIME-INR
INR: 2 — ABNORMAL HIGH (ref 0.8–1.2)
Prothrombin Time: 22.5 s — ABNORMAL HIGH (ref 11.4–15.2)

## 2023-10-07 MED ORDER — MORPHINE SULFATE (PF) 4 MG/ML IV SOLN
4.0000 mg | Freq: Once | INTRAVENOUS | Status: AC
  Administered 2023-10-07: 4 mg via INTRAVENOUS
  Filled 2023-10-07: qty 1

## 2023-10-07 MED ORDER — ONDANSETRON HCL 4 MG/2ML IJ SOLN
4.0000 mg | Freq: Once | INTRAMUSCULAR | Status: AC
  Administered 2023-10-07: 4 mg via INTRAVENOUS
  Filled 2023-10-07: qty 2

## 2023-10-07 NOTE — ED Triage Notes (Signed)
 Pt arrives by [facility] from home due to abdominal pain which began this am. Pt describes a squeezing abodminal pain in her lower abdomen.  No nausea, vomiting or diarrhea with this, no GU symptoms.  No fever.

## 2023-10-07 NOTE — ED Provider Notes (Cosign Needed Addendum)
 [place] EMERGENCY DEPARTMENT AT [facility] Provider Note   CSN: [id number] Arrival date & time: 10/07/23  0944     History  Chief Complaint  Patient presents with   Abdominal Pain    Peggy Bennett is a 23 y.o. female.  23 year old female presents with complaint of abdominal pain, onset this morning upon waking. Pain initially across lower abdomen, radiates at times to periumbilical area. Not associated with nausea, vomiting, changes in bowel or bladder habits, fevers, chills. Pain is intermittent, waxes and wanes in severity. Feels similar to when her INR was significantly elevated previously. Also history of ovarian cyst.  On Coumadin  for aortic valve disorder, also history of CVA Mom at bedside       Home Medications Prior to Admission medications   Medication Sig Start Date End Date Taking? Authorizing Provider  aspirin  81 MG chewable tablet Chew 81 mg by mouth daily.    [provider]  hydrocortisone  (ANUSOL -HC) 25 MG suppository Place 1 suppository (25 mg total) rectally 2 (two) times daily. Patient not taking: Reported on 11/06/2020 10/06/20   Mandy Second, PA-C  omeprazole (PRILOSEC) 20 MG capsule Take 20 mg by mouth daily as needed (heartburn). Patient not taking: Reported on 11/06/2020    [provider]  polyethylene glycol (MIRALAX  / GLYCOLAX ) 17 g packet Take 17 g by mouth daily as needed for mild constipation. Patient not taking: Reported on 11/06/2020    [provider]  sotalol  (BETAPACE ) 80 MG tablet Take 40 mg by mouth 2 (two) times daily.  06/28/19   [provider]  warfarin (COUMADIN ) 1 MG tablet Take 7 mg by mouth daily. 05/24/19   [provider]      Allergies    Patient has no known allergies.    Review of Systems   Review of Systems Negative except as per HPI Physical Exam Updated Vital Signs BP 119/72 (BP Location: Right Arm)   Pulse 68   Temp 99.5 F (37.5 C) (Oral)   Resp 16   SpO2  100%  Physical Exam Vitals and nursing note reviewed.  Constitutional:      General: She is not in acute distress.    Appearance: She is well-developed. She is not diaphoretic.  HENT:     Head: Normocephalic and atraumatic.  Pulmonary:     Effort: Pulmonary effort is normal.  Abdominal:     Palpations: Abdomen is soft.     Tenderness: There is abdominal tenderness in the left lower quadrant. There is no guarding or rebound.     Comments: Mild LLQ tenderness  Skin:    General: Skin is warm and dry.     Findings: No erythema or rash.  Neurological:     Mental Status: She is alert and oriented to person, place, and time.  Psychiatric:        Behavior: Behavior normal.     ED Results / Procedures / Treatments   Labs (all labs ordered are listed, but only abnormal results are displayed) Labs Reviewed  URINALYSIS, ROUTINE W REFLEX MICROSCOPIC - Abnormal; Notable for the following components:      Result Value   APPearance HAZY (*)    Hgb urine dipstick MODERATE (*)    Protein, ur 100 (*)    Bacteria, UA RARE (*)    All other components within normal limits  PROTIME-INR - Abnormal; Notable for the following components:   Prothrombin Time 22.5 (*)    INR 2.0 (*)  All other components within normal limits  LIPASE, BLOOD  COMPREHENSIVE METABOLIC PANEL WITH GFR  CBC  PREGNANCY, URINE    EKG None  Radiology CT Renal Stone Study Result Date: 10/07/2023 EXAM:ABDOMINAL/FLANK PAIN.  RENAL STONES SUSPECTED.  PATIENT COMPLAINS OF LOWER ABDOMINAL PAIN: EXAM:ABDOMINAL/FLANK PAIN. RENAL STONES SUSPECTED. PATIENT COMPLAINS OF LOWER ABDOMINAL PAIN CT ABDOMEN AND PELVIS WITHOUT CONTRAST TECHNIQUE: Multidetector CT imaging of the abdomen and pelvis was performed following the standard protocol without IV contrast. RADIATION DOSE REDUCTION: This exam was performed according to the departmental dose-optimization program which includes automated exposure control, adjustment of the mA and/or  kV according to patient size and/or use of iterative reconstruction technique. COMPARISON:  None Available. FINDINGS: Lower chest: Lung bases are clear without abnormal masses. No pleural effusion. Hepatobiliary: No evidence of intrahepatic or extrahepatic ductal dilatation. Gallbladder is unremarkable in appearance and without evidence of acute findings. No gallbladder stones identified. Pancreas: Unremarkable in this unenhanced study. Spleen: Normal in size and configuration Adrenals/Urinary Tract: No evidence of hydronephrosis or calcifications within the renal collecting system. The right adrenal gland is not well visualized on this unenhanced study. Stomach/Bowel: The stomach is within normal limits. No acute abnormalities within the bowel loops. Vascular/Lymphatic: Minimal calcified arterial plaque within the abdomen or pelvis. No evidence of lymphadenopathy on this unenhanced study. Reproductive: The uterus is poorly visualized the this unenhanced study. No distinction of endometrial stripe. 1.4 x 1.0 cm right adnexal cyst not previously seen on the ultrasound pelvis from 10/07/2023. Potential left adnexal cyst measuring 2.0 x 1.9 cm Other: No free air or fluid identified within the abdomen or pelvis Musculoskeletal: IMPRESSION: Unremarkable for acute abnormalities to suggest etiology of patient's symptoms. No hydronephrosis or renal stone identified. Bilateral adnexal cysts. Electronically Signed   By: Susan Ensign   On: 10/07/2023 15:17   US  Pelvis Complete Result Date: 10/07/2023 CLINICAL DATA:  LLQ pain. EXAM: TRANSABDOMINAL ULTRASOUND OF PELVIS DOPPLER ULTRASOUND OF OVARIES TECHNIQUE: Transabdominal ultrasound examination of the pelvis was performed including evaluation of the uterus, ovaries, adnexal regions, and pelvic cul-de-sac. Color and duplex Doppler ultrasound was utilized to evaluate blood flow to the ovaries. COMPARISON:  None Available. FINDINGS: The technologist noted limited exam due  to bowel gas. Uterus Measurements: 2.6 x 3.2 x 6.0 cm = volume: 26.8 mL. No fibroids or other mass visualized. Endometrium Thickness: 3.7 mm.  No focal abnormality visualized. Right ovary The right ovary was not visualized.  No large adnexal mass seen. Left ovary Measurements: 1.6 x 2.0 x 3.4 cm = volume: 5.6 mL. Normal appearance/no adnexal mass. Pulsed Doppler evaluation demonstrates normal low-resistance arterial and venous waveforms in left ovary. IMPRESSION: *Limited exam. Nonvisualization of the right ovary. Otherwise unremarkable exam. Electronically Signed   By: Beula Brunswick M.D.   On: 10/07/2023 13:31   US  Art/Ven Flow Abd Pelv Doppler Result Date: 10/07/2023 CLINICAL DATA:  LLQ pain. EXAM: TRANSABDOMINAL ULTRASOUND OF PELVIS DOPPLER ULTRASOUND OF OVARIES TECHNIQUE: Transabdominal ultrasound examination of the pelvis was performed including evaluation of the uterus, ovaries, adnexal regions, and pelvic cul-de-sac. Color and duplex Doppler ultrasound was utilized to evaluate blood flow to the ovaries. COMPARISON:  None Available. FINDINGS: The technologist noted limited exam due to bowel gas. Uterus Measurements: 2.6 x 3.2 x 6.0 cm = volume: 26.8 mL. No fibroids or other mass visualized. Endometrium Thickness: 3.7 mm.  No focal abnormality visualized. Right ovary The right ovary was not visualized.  No large adnexal mass seen. Left ovary Measurements: 1.6 x 2.0  x 3.4 cm = volume: 5.6 mL. Normal appearance/no adnexal mass. Pulsed Doppler evaluation demonstrates normal low-resistance arterial and venous waveforms in left ovary. IMPRESSION: *Limited exam. Nonvisualization of the right ovary. Otherwise unremarkable exam. Electronically Signed   By: Beula Brunswick M.D.   On: 10/07/2023 13:31    Procedures Procedures    Medications Ordered in ED Medications  ondansetron  (ZOFRAN ) injection 4 mg (4 mg Intravenous Given 10/07/23 1421)  morphine  (PF) 4 MG/ML injection 4 mg (4 mg Intravenous Given 10/07/23  1422)    ED Course/ Medical Decision Making/ A&P                                 Medical Decision Making Amount and/or Complexity of Data Reviewed Labs: ordered. Radiology: ordered.  Risk Prescription drug management.   This patient presents to the ED for concern of LLQ pain, this involves an extensive number of treatment options, and is a complaint that carries with it a high risk of complications and morbidity.  The differential diagnosis includes but not limited to ovarian cyst/torsion, UTI, pyelonephritis, stone, colitis    Co morbidities that complicate the patient evaluation  Aortic valve disorder    Additional history obtained:  Additional history obtained from mom at bedside  External records from outside source obtained and reviewed including priro INR 2.1 on 5/12.   Lab Tests:  I Ordered, and personally interpreted labs.  The pertinent results include:  hcg negative, INR WNL on coumadin . CBC and CMP WNL. UA with moderate hgb, protein, RBC, not on menstrual cycle    Imaging Studies ordered:  I ordered imaging studies including pelvic US , CT stone study   I independently visualized and interpreted imaging which showed US  with normal left ovary, right ovary not visualized, pt declined TV US , right ovary note seen.  I agree with the radiologist interpretation CT negative for stones, consider possible constipation   Problem List / ED Course / Critical interventions / Medication management  22yo female with complaint of LLQ pain onset this morning. No significant tenderness on exam. Work with with hematuria, not on cycle, no hx of stones. Pelvic US  with normal LLQ, negative for torsion, right ovary not seen, limited study as pt declined TV US . Plan is to evaluate for possible stone with CT. CT negative for stones.  Recommend MiraLAX  for constipation.  Recheck with PCP.  Return to ER as needed. Declined any medications  I have reviewed the patients home medicines and  have made adjustments as needed   Social Determinants of Health:  Lives with family   Test / Admission - Considered:  Stable for discharge, patient to follow-up with PCP.         Final Clinical Impression(s) / ED Diagnoses Final diagnoses:  Left lower quadrant abdominal pain  Hematuria, unspecified type    Rx / DC Orders ED Discharge Orders     None         Yukta, Avino, PA-C 10/07/23 1512    Darlis Eisenmenger, PA-C 10/07/23 1524    Nora Beal, [place] K, DO 10/08/23 1558

## 2023-10-07 NOTE — Discharge Instructions (Addendum)
 Follow up with your primary care provider for recheck and repeat urine due to blood in your urine today. Return to the ER for worsening or concerning symptoms.   Your CT may show some constipation, recommend miralax  as directed.

## 2023-10-07 NOTE — ED Provider Triage Note (Signed)
 Emergency Medicine Provider Triage Evaluation Note  Deberah Falconer , a 23 y.o. female  was evaluated in triage.  Pt complains of abdominal pain.  Noted around 9 AM this morning.  All about the lower abdomen.  Endorsing abnormal vaginal spotting.  Denies nausea vomiting diarrhea.  Denies urinary symptoms.  Review of Systems  Positive: See above Negative: See above  Physical Exam  BP 103/64 (BP Location: Right Arm)   Pulse 64   Temp 98.3 F (36.8 C) (Oral)   Resp 16   SpO2 100%  Gen:   Awake, no distress   Resp:  Normal effort  MSK:   Moves extremities without difficulty  Other:    Medical Decision Making  Medically screening exam initiated at 10:01 AM.  Appropriate orders placed.  MARIALUIZA WARLEY was informed that the remainder of the evaluation will be completed by another provider, this initial triage assessment does not replace that evaluation, and the importance of remaining in the ED until their evaluation is complete.  Work up started   Janalee Mcmurray, PA-C 10/07/23 1002

## 2023-11-11 ENCOUNTER — Ambulatory Visit (HOSPITAL_COMMUNITY)
Admission: EM | Admit: 2023-11-11 | Discharge: 2023-11-11 | Disposition: A | Discharge status: Home / Self Care | Attending: Emergency Medicine | Admitting: Emergency Medicine

## 2023-11-11 ENCOUNTER — Encounter (HOSPITAL_COMMUNITY): Payer: Self-pay | Admitting: Emergency Medicine

## 2023-11-11 DIAGNOSIS — M546 Pain in thoracic spine: Secondary | ICD-10-CM | POA: Diagnosis not present

## 2023-11-11 DIAGNOSIS — K529 Noninfective gastroenteritis and colitis, unspecified: Secondary | ICD-10-CM | POA: Diagnosis not present

## 2023-11-11 LAB — POCT URINALYSIS DIP (MANUAL ENTRY)
Bilirubin, UA: NEGATIVE
Glucose, UA: NEGATIVE mg/dL
Ketones, POC UA: NEGATIVE mg/dL
Leukocytes, UA: NEGATIVE
Nitrite, UA: NEGATIVE
Protein Ur, POC: NEGATIVE mg/dL
Spec Grav, UA: 1.015 (ref 1.010–1.025)
Urobilinogen, UA: 0.2 U/dL
pH, UA: 6 (ref 5.0–8.0)

## 2023-11-11 MED ORDER — ONDANSETRON HCL 4 MG/2ML IJ SOLN
INTRAMUSCULAR | Status: AC
  Filled 2023-11-11: qty 2

## 2023-11-11 MED ORDER — ONDANSETRON 4 MG PO TBDP
4.0000 mg | ORAL_TABLET | Freq: Once | ORAL | Status: AC
  Administered 2023-11-11: 4 mg via ORAL

## 2023-11-11 MED ORDER — ONDANSETRON 4 MG PO TBDP
ORAL_TABLET | ORAL | Status: AC
  Filled 2023-11-11: qty 1

## 2023-11-11 MED ORDER — CYCLOBENZAPRINE HCL 10 MG PO TABS: 10.0000 mg | ORAL_TABLET | Freq: Two times a day (BID) | ORAL | 0 refills | Status: DC | PRN

## 2023-11-11 MED ORDER — ONDANSETRON HCL 4 MG PO TABS: 4.0000 mg | ORAL_TABLET | Freq: Four times a day (QID) | ORAL | 0 refills | Status: DC

## 2023-11-11 MED ORDER — ONDANSETRON HCL 4 MG/2ML IJ SOLN
4.0000 mg | Freq: Once | INTRAMUSCULAR | Status: AC
  Administered 2023-11-11: 4 mg via INTRAMUSCULAR

## 2023-11-11 NOTE — Discharge Instructions (Addendum)
 The zofran  can be used every 6 hours as needed to settle the stomach Drink lots of water to keep hydrated  Bland diet if tolerated - toast, crackers, rice, banana etc Tylenol  for pain You can take the muscle relaxer Flexeril twice daily. If the medication makes you drowsy, take only at bed time.  Please monitor symptoms and go to the emergency department if anything worsens.

## 2023-11-11 NOTE — ED Provider Notes (Signed)
 [facility]    CSN: [id number] Arrival date & time: 11/11/23  1505      History   Chief Complaint Chief Complaint  Patient presents with   Abdominal Pain   Back Pain    HPI Peggy Bennett is a 23 y.o. female.  Yesterday left middle back pain and some abdominal discomfort. Softer stools last night. No blood. Today she ate a snow cone and then vomited. Since then having nausea and a few more episodes of emesis.  No fevers Denies urinary symptoms. LMP started 5 days ago, still spotting Not sure of any sick contacts No interventions yet  Reports back pain in this area on and off since she fell on the stairs a year ago. Never had it evaluated.   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   History of surgery to heart and great vessels 06/20/2013   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 Authorizing Provider  buPROPion (WELLBUTRIN  XL) 300 MG 24 hr tablet Take 300 mg by mouth daily. 09/16/23  Yes [provider]  cyclobenzaprine (FLEXERIL) 10 MG tablet Take 1 tablet (10 mg total) by mouth 2 (two) times daily as needed for muscle spasms. 11/11/23  Yes Aliesha Dolata, Ivette Marks, PA-C  ondansetron  (ZOFRAN ) 4 MG tablet Take 1 tablet (4 mg total) by mouth every 6 (six) hours. 11/11/23  Yes Ritter Helsley, Ivette Marks, PA-C  aspirin  81 MG chewable tablet Chew 81 mg by mouth daily.    [provider]  sotalol  (BETAPACE ) 80 MG tablet Take 40 mg by mouth 2 (two) times daily.  06/28/19   [provider]  warfarin (COUMADIN ) 1 MG tablet Take 7 mg by mouth daily. 05/24/19   [provider]    Family History Family History  Problem Relation Age of Onset   Cancer Maternal Grandmother        renal   Renal Disease Maternal Grandfather    Diabetes Father    Hyperlipidemia Mother     Social History Social History   Tobacco Use   Smoking status: Never   Smokeless tobacco: Never  Substance Use Topics  Alcohol use: Yes    Comment: social   Drug use: Never     Allergies   Patient has no known allergies.   Review of Systems Review of Systems As per HPI  Physical Exam Triage Vital Signs ED Triage Vitals  Encounter Vitals Group     BP 11/11/23 1704 (!) 133/94     Girls Systolic BP Percentile --      Girls Diastolic BP Percentile --      Boys Systolic BP Percentile --      Boys Diastolic BP Percentile --      Pulse Rate 11/11/23 1704 73     Resp 11/11/23 1704 14     Temp 11/11/23 1704 (!) 97.5 F (36.4 C)     Temp Source 11/11/23 1704 Oral     SpO2 11/11/23 1704 100 %     Weight --      Height --      Head Circumference --      Peak Flow --      Pain Score 11/11/23 1702 8     Pain Loc --      Pain Education --      Exclude from Growth Chart --    No data found.  Updated Vital Signs BP (!) 133/94 (BP Location: Left Arm)   Pulse 73   Temp (!) 97.5 F (36.4 C) (Oral)   Resp 14   LMP  11/06/2023 (Exact Date)   SpO2 100%    Physical Exam Vitals and nursing note reviewed.  Constitutional:      General: She is not in acute distress. HENT:     Mouth/Throat:     Mouth: Mucous membranes are moist.     Pharynx: Oropharynx is clear.   Eyes:     Extraocular Movements: Extraocular movements intact.     Conjunctiva/sclera: Conjunctivae normal.     Pupils: Pupils are equal, round, and reactive to light.    Cardiovascular:     Rate and Rhythm: Normal rate and regular rhythm.     Heart sounds: Normal heart sounds.  Pulmonary:     Effort: Pulmonary effort is normal.     Breath sounds: Normal breath sounds.  Abdominal:     Palpations: Abdomen is soft.     Tenderness: There is no abdominal tenderness. There is no right CVA tenderness, left CVA tenderness or guarding.   Musculoskeletal:        General: Normal range of motion.     Cervical back: Normal range of motion. No rigidity or tenderness.       Back:     Comments: Mild left sided back tenderness, no CVA tenderness. No rash or lesions in area   Skin:    General: Skin is warm and dry.   Neurological:     General: No focal deficit present.     Mental Status: She is alert and oriented to person, place, and time.     Cranial Nerves: Cranial nerves 2-12 are intact. No cranial nerve deficit.     Sensory: Sensation is intact.     Motor: Motor function is intact. No weakness.     Coordination: Coordination is intact.     Gait: Gait is intact.     Deep Tendon Reflexes: Reflexes are normal and symmetric.     Comments: Strength 5/5. Sensation intact throughout      UC Treatments / Results  Labs (all labs ordered are listed, but only abnormal results are displayed) Labs Reviewed  POCT URINALYSIS DIP (MANUAL ENTRY) - Abnormal; Notable for the following components:      Result Value   Blood, UA small (*)    All other components within normal limits  URINE CULTURE    EKG  Radiology No results  found.  Procedures Procedures (including critical care time)  Medications Ordered in UC Medications  ondansetron  (ZOFRAN -ODT) disintegrating tablet 4 mg (4 mg Oral Given 11/11/23 1736)  ondansetron  (ZOFRAN ) injection 4 mg (4 mg Intramuscular Given 11/11/23 1831)    Initial Impression / Assessment and Plan / UC Course  I have reviewed the triage vital signs and the nursing notes.  Pertinent labs & imaging results that were available during my care of the patient were reviewed by me and considered in my medical decision making (see chart for details).  UA small RBC. Likely menstrual given she is still spotting however, will culture. Zofran  ODT given, patient vomited about 5 minutes later IM dose given. Nausea resolved, patient feeling improved. PO Challenge successful. Advised zofran  q6 hours prn, increase fluids, bland diet. Can use tylenol  and flexeril for back pain. Return and ED precautions. Patient agreeable to plan, no questions  Final Clinical Impressions(s) / UC Diagnoses   Final diagnoses:  Gastroenteritis  Acute left-sided thoracic back pain     Discharge Instructions      The zofran  can be used every 6 hours as needed to settle the stomach Drink lots of water to keep hydrated  Bland diet if tolerated - toast, crackers, rice, banana etc Tylenol  for pain You can take the muscle relaxer Flexeril twice daily. If the medication makes you drowsy, take only at bed time.  Please monitor symptoms and go to the emergency department if anything worsens.     ED Prescriptions     Medication Sig Dispense Auth. Provider   ondansetron  (ZOFRAN ) 4 MG tablet Take 1 tablet (4 mg total) by mouth every 6 (six) hours. 20 tablet Tyshay Adee, PA-C   cyclobenzaprine (FLEXERIL) 10 MG tablet Take 1 tablet (10 mg total) by mouth 2 (two) times daily as needed for muscle spasms. 20 tablet Shanty Ginty, Ivette Marks, PA-C      PDMP not reviewed this encounter.   Newton Barer 11/11/23  2952

## 2023-11-11 NOTE — ED Triage Notes (Signed)
 Pt reports yesterday had abd pain and back pain and had 3 BMs that were soft but normal.  Today didn't have anything but back pain so went out with her friend. Ate a snow cone then vomited and started having abd pain again .denies urinary problems.

## 2023-11-12 LAB — URINE CULTURE: Culture: NO GROWTH

## 2023-11-15 ENCOUNTER — Ambulatory Visit (HOSPITAL_COMMUNITY): Payer: Self-pay

## 2023-11-18 DIAGNOSIS — F411 Generalized anxiety disorder: Secondary | ICD-10-CM | POA: Diagnosis not present

## 2023-11-18 DIAGNOSIS — F341 Dysthymic disorder: Secondary | ICD-10-CM | POA: Diagnosis not present

## 2023-11-18 DIAGNOSIS — F331 Major depressive disorder, recurrent, moderate: Secondary | ICD-10-CM | POA: Diagnosis not present

## 2023-12-09 DIAGNOSIS — Z86718 Personal history of other venous thrombosis and embolism: Secondary | ICD-10-CM | POA: Diagnosis not present

## 2023-12-09 DIAGNOSIS — Z952 Presence of prosthetic heart valve: Secondary | ICD-10-CM | POA: Diagnosis not present

## 2023-12-16 ENCOUNTER — Encounter: Payer: Self-pay | Admitting: Nurse Practitioner

## 2023-12-16 ENCOUNTER — Ambulatory Visit: Admitting: Nurse Practitioner

## 2023-12-16 VITALS — BP 90/62 | HR 69 | Temp 98.0°F | Ht 64.0 in | Wt 102.8 lb

## 2023-12-16 DIAGNOSIS — Z8673 Personal history of transient ischemic attack (TIA), and cerebral infarction without residual deficits: Secondary | ICD-10-CM | POA: Diagnosis not present

## 2023-12-16 DIAGNOSIS — I359 Nonrheumatic aortic valve disorder, unspecified: Secondary | ICD-10-CM | POA: Diagnosis not present

## 2023-12-16 DIAGNOSIS — F411 Generalized anxiety disorder: Secondary | ICD-10-CM | POA: Diagnosis not present

## 2023-12-16 DIAGNOSIS — Z95810 Presence of automatic (implantable) cardiac defibrillator: Secondary | ICD-10-CM | POA: Diagnosis not present

## 2023-12-16 DIAGNOSIS — Z952 Presence of prosthetic heart valve: Secondary | ICD-10-CM

## 2023-12-16 DIAGNOSIS — Z0000 Encounter for general adult medical examination without abnormal findings: Secondary | ICD-10-CM | POA: Diagnosis not present

## 2023-12-16 DIAGNOSIS — F341 Dysthymic disorder: Secondary | ICD-10-CM | POA: Diagnosis not present

## 2023-12-16 DIAGNOSIS — F331 Major depressive disorder, recurrent, moderate: Secondary | ICD-10-CM | POA: Diagnosis not present

## 2023-12-16 NOTE — Assessment & Plan Note (Signed)
 Discussed age-appropriate immunizations and screening exams.  Did review patient's personal, surgical, social, family history.  Patient is up-to-date on all age-appropriate vaccinations she would like.  Did recommend patient get a Tdap, HPV, Prevnar 20.  Patient is too young for CRC screening breast cancer screening.  Patient is here for cervical cancer screening.  Patient is not currently sexually active.  Patient was given information at discharge about limited healthcare maintenance with anticipatory guidance

## 2023-12-16 NOTE — Assessment & Plan Note (Signed)
 History of valve replacement with ICD implanted.  Patient is anticoagulated and followed by cardiology. Continue warfarin as prescribed and following with cardiology as recommended

## 2023-12-16 NOTE — Assessment & Plan Note (Signed)
 Patient has remote check and is currently followed by cardiology continue

## 2023-12-16 NOTE — Assessment & Plan Note (Signed)
 Anticoagulated followed by cardiology continue

## 2023-12-16 NOTE — Patient Instructions (Signed)
 Nice to see you today  The recommended vaccines are : Tetanus (TDAP) HPV (3 shot series at your age) Prevnar 20 (pneumonia vaccine) just 1 dose You can talk with Dr Keneth about them too  Follow up with me in 1 year, sooner if you need me    We do recommend pap smears starting at age 21

## 2023-12-16 NOTE — Assessment & Plan Note (Signed)
 Left-sided deficits

## 2023-12-16 NOTE — Progress Notes (Signed)
 New Patient Office Visit  Subjective    Patient ID: Peggy Bennett, female    DOB: Mar 18, 2001  Age: 23 y.o. MRN: [id number]  CC:  Chief Complaint  Patient presents with   Establish Care    General check up      HPI Peggy Bennett presents to establish care  for complete physical and follow up of chronic conditions.  Aortic valve disorder: History of mechanical valve replacement followed by [facility] pediatric cardiology patient is anticoagulated with warfarin as at home PT/INR checks  Mood: Phares Fell with thrive works. States that she is doing just medicaoitn. Has done therapy in the past. Has appt virtually today   CVA: patient is on betapace , asa, and warfarin she is followed by [facility] pediatric cardiology  Left sided weakness risdual. States that she was 18 when she had the stroke   Immunizations: -Tetanus: Completed in 2015, discussed updating office -Influenza: out of season  -Shingles: too young  -Pneumonia:  discussed updating in office today -HPV: Discussed updating in office today  Diet: Fair diet. She is eating 1-2 meals a day and some snacks. She drinks coffee water soda and tea  Exercise: No regular exercise. None over the summer. At school she will do pilates and yoga   Eye exam: PRN Dental exam: Completes semi-annually    Colonoscopy: Too young, currently average risk Lung Cancer Screening: N/A  Pap smear: too young. Not currently or ever sexually active   Sleep: goes to bed around 2-4a and will get up around 10-11. Feels rested most time During school she will fall alseep 2-4 am and get up because of classes. Does not snore    Last period was July 11 and will last for 7 days.  They are regular    Outpatient Encounter Medications as of 12/16/2023  Medication Sig   aspirin  81 MG chewable tablet Chew 81 mg by mouth daily.   buPROPion (WELLBUTRIN XL) 300 MG 24 hr tablet Take 300 mg by mouth daily.   cyclobenzaprine  (FLEXERIL ) 10 MG tablet Take 1 tablet (10  mg total) by mouth 2 (two) times daily as needed for muscle spasms.   ondansetron  (ZOFRAN ) 4 MG tablet Take 1 tablet (4 mg total) by mouth every 6 (six) hours.   sotalol  (BETAPACE ) 80 MG tablet Take 40 mg by mouth 2 (two) times daily.    warfarin (COUMADIN ) 1 MG tablet Take 7 mg by mouth daily.   No facility-administered encounter medications on file as of 12/16/2023.    Past Medical History:  Diagnosis Date   Stroke [facility])    Phreesia 01/16/2020   Truncus arteriosus     Past Surgical History:  Procedure Laterality Date   AORTIC VALVE REPLACEMENT     CARDIAC DEFIBRILLATOR PLACEMENT     CARDIAC VALVE REPLACEMENT N/A    Phreesia 01/16/2020   ICD IMPLANT  2017   TRUNCUS ARTERIOSUS REPAIR      Family History  Problem Relation Age of Onset   Cancer Maternal Grandmother        renal   Renal Disease Maternal Grandfather    Diabetes Father    Hyperlipidemia Mother     Social History   Socioeconomic History   Marital status: Single    Spouse name: Not on file   Number of children: 0   Years of education: Not on file   Highest education level: Not on file  Occupational History   Not on file  Tobacco Use  Smoking status: Never   Smokeless tobacco: Never  Vaping Use   Vaping status: Never Used  Substance and Sexual Activity   Alcohol use: Not Currently    Comment: social   Drug use: Never   Sexual activity: Not Currently  Other Topics Concern   Not on file  Social History Narrative   In college [employer]. 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      Lives on campus during       Lives with mom and dad in [place] during off season    Social Drivers of Health   Financial Resource Strain: Low Risk  (10/20/2022)   Received from [facility]   Overall Financial Resource Strain (CARDIA)    Difficulty of Paying Living Expenses: Not hard at all  Food Insecurity: No Food Insecurity (10/20/2022)   Received from [facility]   Hunger Vital Sign    Within  the past 12 months, you worried that your food would run out before you got the money to buy more.: Never true    Within the past 12 months, the food you bought just didn't last and you didn't have money to get more.: Never true  Transportation Needs: No Transportation Needs (10/20/2022)   [facility] from [facility]   PRAPARE - Transportation    Lack of Transportation (Medical): No    Lack of Transportation (Non-Medical): No  Physical Activity: Not on file  Stress: Not on file  Social Connections: Unknown (10/07/2021)   Received from [facility]   Social Network    Social Network: Not on file  Intimate Partner Violence: Unknown (08/29/2021)   Received from [facility]   HITS    Physically Hurt: Not on file    Insult or Talk Down To: Not on file    Threaten Physical Harm: Not on file    Scream or Curse: Not on file    Review of Systems  Constitutional:  Negative for chills and fever.  Respiratory:  Negative for shortness of breath.   Cardiovascular:  Negative for chest pain and leg swelling.  Gastrointestinal:  Negative for abdominal pain, blood in stool, constipation, diarrhea, nausea and vomiting.       BM every other day    Genitourinary:  Negative for dysuria and hematuria.  Neurological:  Negative for dizziness, tingling and headaches.  Psychiatric/Behavioral:  Negative for hallucinations and suicidal ideas.         Objective    BP 90/62   Pulse 69   Temp 98 F (36.7 C) (Oral)   Ht 5' 4 (1.626 m)   Wt 102 lb 12.8 oz (46.6 kg)   LMP 12/03/2023 (Exact Date)   SpO2 100%   BMI 17.65 kg/m   Physical Exam Vitals and nursing note reviewed.  Constitutional:      Appearance: Normal appearance.  HENT:     Right Ear: Tympanic membrane, ear canal and external ear normal.     Left Ear: Tympanic membrane, ear canal and external ear normal.     Mouth/Throat:     Mouth: Mucous membranes are moist.     Pharynx: Oropharynx is clear.  Eyes:     Extraocular Movements:  Extraocular movements intact.     Pupils: Pupils are equal, round, and reactive to light.  Cardiovascular:     Rate and Rhythm: Normal rate and regular rhythm.     Pulses: Normal pulses.     Heart sounds: Murmur heard.  Comments: Mechanical valve click Pulmonary:     Effort: Pulmonary effort is normal.     Breath sounds: Normal breath sounds.  Abdominal:     General: Bowel sounds are normal. There is no distension.     Palpations: There is no mass.     Tenderness: There is no abdominal tenderness.     Hernia: No hernia is present.  Musculoskeletal:     Right lower leg: No edema.     Left lower leg: No edema.  Lymphadenopathy:     Cervical: No cervical adenopathy.  Skin:    General: Skin is warm.  Neurological:     General: No focal deficit present.     Mental Status: She is alert.     Deep Tendon Reflexes:     Reflex Scores:      Bicep reflexes are 2+ on the right side and 2+ on the left side.      Patellar reflexes are 2+ on the right side and 2+ on the left side.    Comments: Bilateral upper and lower extremity strength 5/5  Psychiatric:        Mood and Affect: Mood normal.        Behavior: Behavior normal.        Thought Content: Thought content normal.        Judgment: Judgment normal.         Assessment & Plan:   Problem List Items Addressed This Visit       Cardiovascular and Mediastinum   Aortic valve disorder   History of valve replacement with ICD implanted.  Patient is anticoagulated and followed by cardiology. Continue warfarin as prescribed and following with cardiology as recommended         Other   ICD (implantable cardioverter-defibrillator) in place   Patient has remote check and is currently followed by cardiology continue      History of prosthetic aortic valve   Anticoagulated followed by cardiology continue      History of stroke   Left-sided deficits      Preventative health care - Primary   Discussed age-appropriate  immunizations and screening exams.  Did review patient's personal, surgical, social, family history.  Patient is up-to-date on all age-appropriate vaccinations she would like.  Did recommend patient get a Tdap, HPV, Prevnar 20.  Patient is too young for CRC screening breast cancer screening.  Patient is here for cervical cancer screening.  Patient is not currently sexually active.  Patient was given information at discharge about limited healthcare maintenance with anticipatory guidance       Return in about 1 year (around 12/15/2024) for CPE and Labs.   Adina Crandall, NP

## 2023-12-21 DIAGNOSIS — I359 Nonrheumatic aortic valve disorder, unspecified: Secondary | ICD-10-CM | POA: Diagnosis not present

## 2023-12-21 DIAGNOSIS — Z950 Presence of cardiac pacemaker: Secondary | ICD-10-CM | POA: Diagnosis not present

## 2023-12-21 DIAGNOSIS — Q200 Common arterial trunk: Secondary | ICD-10-CM | POA: Diagnosis not present

## 2023-12-27 DIAGNOSIS — F4010 Social phobia, unspecified: Secondary | ICD-10-CM | POA: Diagnosis not present

## 2024-01-03 ENCOUNTER — Ambulatory Visit: Admitting: General Practice

## 2024-01-06 DIAGNOSIS — Z86718 Personal history of other venous thrombosis and embolism: Secondary | ICD-10-CM | POA: Diagnosis not present

## 2024-01-06 DIAGNOSIS — Z952 Presence of prosthetic heart valve: Secondary | ICD-10-CM | POA: Diagnosis not present

## 2024-01-07 DIAGNOSIS — F4323 Adjustment disorder with mixed anxiety and depressed mood: Secondary | ICD-10-CM | POA: Diagnosis not present

## 2024-01-19 DIAGNOSIS — F4320 Adjustment disorder, unspecified: Secondary | ICD-10-CM | POA: Diagnosis not present

## 2024-01-25 DIAGNOSIS — Z79899 Other long term (current) drug therapy: Secondary | ICD-10-CM | POA: Diagnosis not present

## 2024-01-25 DIAGNOSIS — F4010 Social phobia, unspecified: Secondary | ICD-10-CM | POA: Diagnosis not present

## 2024-01-25 DIAGNOSIS — F339 Major depressive disorder, recurrent, unspecified: Secondary | ICD-10-CM | POA: Diagnosis not present

## 2024-02-03 DIAGNOSIS — Z952 Presence of prosthetic heart valve: Secondary | ICD-10-CM | POA: Diagnosis not present

## 2024-02-03 DIAGNOSIS — Z86718 Personal history of other venous thrombosis and embolism: Secondary | ICD-10-CM | POA: Diagnosis not present

## 2024-02-16 DIAGNOSIS — Z79899 Other long term (current) drug therapy: Secondary | ICD-10-CM | POA: Diagnosis not present

## 2024-02-16 DIAGNOSIS — F4010 Social phobia, unspecified: Secondary | ICD-10-CM | POA: Diagnosis not present

## 2024-02-16 DIAGNOSIS — F339 Major depressive disorder, recurrent, unspecified: Secondary | ICD-10-CM | POA: Diagnosis not present

## 2024-03-15 DIAGNOSIS — F4010 Social phobia, unspecified: Secondary | ICD-10-CM | POA: Diagnosis not present

## 2024-03-15 DIAGNOSIS — F339 Major depressive disorder, recurrent, unspecified: Secondary | ICD-10-CM | POA: Diagnosis not present

## 2024-03-15 DIAGNOSIS — Z79899 Other long term (current) drug therapy: Secondary | ICD-10-CM | POA: Diagnosis not present

## 2024-04-05 DIAGNOSIS — E041 Nontoxic single thyroid nodule: Secondary | ICD-10-CM | POA: Diagnosis not present

## 2024-04-05 DIAGNOSIS — R634 Abnormal weight loss: Secondary | ICD-10-CM | POA: Diagnosis not present

## 2024-04-05 DIAGNOSIS — Z681 Body mass index (BMI) 19 or less, adult: Secondary | ICD-10-CM | POA: Diagnosis not present

## 2024-04-05 DIAGNOSIS — E0590 Thyrotoxicosis, unspecified without thyrotoxic crisis or storm: Secondary | ICD-10-CM | POA: Diagnosis not present

## 2024-05-15 ENCOUNTER — Emergency Department (HOSPITAL_COMMUNITY)
Admission: EM | Admit: 2024-05-15 | Discharge: 2024-05-15 | Disposition: A | Discharge status: Home / Self Care | Attending: Emergency Medicine | Admitting: Emergency Medicine

## 2024-05-15 ENCOUNTER — Encounter (HOSPITAL_COMMUNITY): Payer: Self-pay

## 2024-05-15 ENCOUNTER — Other Ambulatory Visit: Payer: Self-pay

## 2024-05-15 DIAGNOSIS — N939 Abnormal uterine and vaginal bleeding, unspecified: Secondary | ICD-10-CM | POA: Insufficient documentation

## 2024-05-15 DIAGNOSIS — Z7901 Long term (current) use of anticoagulants: Secondary | ICD-10-CM | POA: Insufficient documentation

## 2024-05-15 DIAGNOSIS — Z7982 Long term (current) use of aspirin: Secondary | ICD-10-CM | POA: Insufficient documentation

## 2024-05-15 DIAGNOSIS — T45511A Poisoning by anticoagulants, accidental (unintentional), initial encounter: Secondary | ICD-10-CM | POA: Diagnosis present

## 2024-05-15 DIAGNOSIS — Z8673 Personal history of transient ischemic attack (TIA), and cerebral infarction without residual deficits: Secondary | ICD-10-CM | POA: Insufficient documentation

## 2024-05-15 LAB — CBC
HCT: 38.2 % (ref 36.0–46.0)
Hemoglobin: 11.8 g/dL — ABNORMAL LOW (ref 12.0–15.0)
MCH: 26.7 pg (ref 26.0–34.0)
MCHC: 30.9 g/dL (ref 30.0–36.0)
MCV: 86.4 fL (ref 80.0–100.0)
Platelets: 302 K/uL (ref 150–400)
RBC: 4.42 MIL/uL (ref 3.87–5.11)
RDW: 13.9 % (ref 11.5–15.5)
WBC: 4.1 K/uL (ref 4.0–10.5)
nRBC: 0 % (ref 0.0–0.2)

## 2024-05-15 LAB — BASIC METABOLIC PANEL WITH GFR
Anion gap: 9 (ref 5–15)
BUN: 17 mg/dL (ref 6–20)
CO2: 26 mmol/L (ref 22–32)
Calcium: 9.5 mg/dL (ref 8.9–10.3)
Chloride: 101 mmol/L (ref 98–111)
Creatinine, Ser: 0.83 mg/dL (ref 0.44–1.00)
GFR, Estimated: >60 mL/min
Glucose, Bld: 87 mg/dL (ref 70–99)
Potassium: 4.3 mmol/L (ref 3.5–5.1)
Sodium: 136 mmol/L (ref 135–145)

## 2024-05-15 LAB — PROTIME-INR
INR: 11 (ref 0.8–1.2)
Prothrombin Time: 89.5 s — ABNORMAL HIGH (ref 11.4–15.2)

## 2024-05-15 MED ORDER — PHYTONADIONE 5 MG PO TABS
2.5000 mg | ORAL_TABLET | Freq: Once | ORAL | Status: AC
  Administered 2024-05-15: 2.5 mg via ORAL
  Filled 2024-05-15: qty 1

## 2024-05-15 NOTE — Discharge Instructions (Signed)
 Your INR today was 11.  Continue to hold your warfarin for the next 2 days.  INR should be rechecked on Tuesday or Wednesday.  Reach out to your outpatient providers for assistance with lab recheck and further guidance based on your repeat INR.  If you have any worsening of bleeding, return to the emergency department.

## 2024-05-15 NOTE — ED Provider Notes (Signed)
 " [place] EMERGENCY DEPARTMENT AT [facility] Provider Note   CSN: [id number] Arrival date & time: 05/15/24  0154     Patient presents with: Drug Overdose   Peggy Bennett is a 23 y.o. female.    Drug Overdose  Patient presents for accidental warfarin overdose. Medical history includes truncus arteriosus s/p repair; anemia; CVA.  She has been on warfarin since the age of 9 due to presence of a mechanical valve.  She typically takes 7 mg of warfarin per day.  On Friday and Saturday, she took 7 tablets of what she thought was 1 mg warfarin.  She subsequently realized that these were 7.5 mg tablets.  She did not take any warfarin on Sunday.  She did have some bleeding.  She picked a scab on her right flank which did cause some bleeding onto her clothes.  She developed vaginal bleeding earlier this evening despite not expecting her menstrual cycle.  She describes amount of vaginal bleeding as mild spotting.  She denies any other new or current symptoms.  She does have home kits to check her INR.  She did use 1 of these in results were greater than 8.  She spoke with poison control who recommended PCP follow-up.     Prior to Admission medications  Medication Sig Start Date End Date Taking? Authorizing Provider  aspirin  81 MG chewable tablet Chew 81 mg by mouth daily.    [provider]  buPROPion (WELLBUTRIN XL) 300 MG 24 hr tablet Take 300 mg by mouth daily. 09/16/23   [provider]  cyclobenzaprine  (FLEXERIL ) 10 MG tablet Take 1 tablet (10 mg total) by mouth 2 (two) times daily as needed for muscle spasms. 11/11/23   Rising, Asberry, PA-C  ondansetron  (ZOFRAN ) 4 MG tablet Take 1 tablet (4 mg total) by mouth every 6 (six) hours. 11/11/23   Rising, Asberry, PA-C  sotalol  (BETAPACE ) 80 MG tablet Take 40 mg by mouth 2 (two) times daily.  06/28/19   [provider]  warfarin (COUMADIN ) 1 MG tablet Take 7 mg by mouth daily. 05/24/19   [provider]     Allergies: Patient has no known allergies.    Review of Systems  Genitourinary:  Positive for vaginal bleeding.  All other systems reviewed and are negative.   Updated Vital Signs BP (!) 132/49   Pulse 75   Temp 98.6 F (37 C) (Oral)   Resp 18   SpO2 100%   Physical Exam Vitals and nursing note reviewed.  Constitutional:      General: She is not in acute distress.    Appearance: Normal appearance. She is well-developed. She is not ill-appearing, toxic-appearing or diaphoretic.  HENT:     Head: Normocephalic and atraumatic.     Right Ear: External ear normal.     Left Ear: External ear normal.     Nose: Nose normal.     Mouth/Throat:     Mouth: Mucous membranes are moist.  Eyes:     Extraocular Movements: Extraocular movements intact.     Conjunctiva/sclera: Conjunctivae normal.  Cardiovascular:     Rate and Rhythm: Normal rate and regular rhythm.  Pulmonary:     Effort: Pulmonary effort is normal. No respiratory distress.  Abdominal:     General: There is no distension.     Palpations: Abdomen is soft.     Tenderness: There is no abdominal tenderness.  Musculoskeletal:        General: No swelling. Normal  range of motion.     Cervical back: Normal range of motion and neck supple.  Skin:    General: Skin is warm and dry.     Coloration: Skin is not jaundiced or pale.  Neurological:     General: No focal deficit present.     Mental Status: She is alert and oriented to person, place, and time.  Psychiatric:        Mood and Affect: Mood normal.        Behavior: Behavior normal.     (all labs ordered are listed, but only abnormal results are displayed) Labs Reviewed  CBC - Abnormal; Notable for the following components:      Result Value   Hemoglobin 11.8 (*)    All other components within normal limits  PROTIME-INR - Abnormal; Notable for the following components:   Prothrombin Time 89.5 (*)    INR 11.0 (*)    All other components within normal limits   BASIC METABOLIC PANEL WITH GFR    EKG: None  Radiology: No results found.   Procedures   Medications Ordered in the ED  phytonadione  (VITAMIN K) tablet 2.5 mg (has no administration in time range)                                    Medical Decision Making Amount and/or Complexity of Data Reviewed Labs: ordered.  Risk Prescription drug management.   Patient presenting for accidental warfarin overdose.  She has developed some mild vaginal bleeding and did have some bleeding from a picked scab on her right flank.  Last dose of warfarin was over 24 hours ago.  On exam, she is well-appearing.  Lab work was ordered.  Patient's hemoglobin today does appear to be baseline.  I do not suspect any clinically significant blood loss.  Her INR is 11.  I spoke with pharmacy, who does recommend 2.5 mg dose of oral vitamin K.  This was ordered.  Pharmacy also recommends that patient recheck her INR in outpatient clinic prior to restarting.  Patient reports that she does have a clinic that she can go to to recheck her INR.  She was advised to do this in 2 days from now and to hold her warfarin until then.  Patient also has a person that she calls for recommendations on dosage adjustments when her INR is above or below goal.  Patient to reach out to this outpatient provider for further guidance after INR recheck.  Patient was advised to return if she does have any worsening of bleeding.  She is stable for discharge at this time.     Final diagnoses:  Warfarin overdosage, accidental or unintentional, initial encounter    ED Discharge Orders          Ordered    Protime-INR        05/15/24 0349               Melvenia Motto, MD 05/15/24 0354  "

## 2024-05-15 NOTE — ED Triage Notes (Addendum)
 Patient reports taking 7 (7.5mg ) warfarin tablets on Friday and Saturday. Patient states she was taking 7 (1mg  tablets) prior to that and got confused. Patient has an extensive pediatric cardiac history. Patients medications and Dr's recently changed. Patient states she recently started having vaginal bleeding prior to coming to the ER and she is not expecting her menstrual cycle. Also reports her INR is greater than 8-using her home INR kit.   Per Poison Control:  They already spoke with the patient and advised this was a subtoxic exposure and she could follow up with here PCP. Patient just needed to monitor for bleeding. Patient has an INR kit at home aswell that she can use to test. However since she is here, we can get coag labs and they will call back in a few hours to follow up.

## 2024-05-21 ENCOUNTER — Emergency Department (HOSPITAL_COMMUNITY)

## 2024-05-21 ENCOUNTER — Other Ambulatory Visit: Payer: Self-pay

## 2024-05-21 ENCOUNTER — Inpatient Hospital Stay (HOSPITAL_COMMUNITY)
Admission: EM | Admit: 2024-05-21 | Discharge: 2024-05-27 | DRG: 066 | Disposition: A | Discharge status: Home / Self Care | Attending: Neurology | Admitting: Neurology

## 2024-05-21 ENCOUNTER — Encounter (HOSPITAL_COMMUNITY): Payer: Self-pay | Admitting: Radiology

## 2024-05-21 DIAGNOSIS — Z7901 Long term (current) use of anticoagulants: Secondary | ICD-10-CM

## 2024-05-21 DIAGNOSIS — F32A Depression, unspecified: Secondary | ICD-10-CM | POA: Diagnosis present

## 2024-05-21 DIAGNOSIS — D509 Iron deficiency anemia, unspecified: Secondary | ICD-10-CM | POA: Diagnosis present

## 2024-05-21 DIAGNOSIS — R471 Dysarthria and anarthria: Secondary | ICD-10-CM | POA: Diagnosis present

## 2024-05-21 DIAGNOSIS — R29700 NIHSS score 0: Secondary | ICD-10-CM | POA: Diagnosis present

## 2024-05-21 DIAGNOSIS — Z833 Family history of diabetes mellitus: Secondary | ICD-10-CM

## 2024-05-21 DIAGNOSIS — Z7982 Long term (current) use of aspirin: Secondary | ICD-10-CM | POA: Diagnosis not present

## 2024-05-21 DIAGNOSIS — Z8679 Personal history of other diseases of the circulatory system: Secondary | ICD-10-CM

## 2024-05-21 DIAGNOSIS — Z95810 Presence of automatic (implantable) cardiac defibrillator: Secondary | ICD-10-CM

## 2024-05-21 DIAGNOSIS — Z9889 Other specified postprocedural states: Secondary | ICD-10-CM

## 2024-05-21 DIAGNOSIS — G459 Transient cerebral ischemic attack, unspecified: Secondary | ICD-10-CM | POA: Diagnosis present

## 2024-05-21 DIAGNOSIS — Z952 Presence of prosthetic heart valve: Secondary | ICD-10-CM

## 2024-05-21 DIAGNOSIS — G2589 Other specified extrapyramidal and movement disorders: Secondary | ICD-10-CM | POA: Diagnosis not present

## 2024-05-21 DIAGNOSIS — E785 Hyperlipidemia, unspecified: Secondary | ICD-10-CM | POA: Diagnosis present

## 2024-05-21 DIAGNOSIS — R69 Illness, unspecified: Secondary | ICD-10-CM

## 2024-05-21 DIAGNOSIS — Z8673 Personal history of transient ischemic attack (TIA), and cerebral infarction without residual deficits: Secondary | ICD-10-CM

## 2024-05-21 DIAGNOSIS — I69354 Hemiplegia and hemiparesis following cerebral infarction affecting left non-dominant side: Secondary | ICD-10-CM

## 2024-05-21 DIAGNOSIS — Z79899 Other long term (current) drug therapy: Secondary | ICD-10-CM

## 2024-05-21 DIAGNOSIS — R791 Abnormal coagulation profile: Secondary | ICD-10-CM | POA: Diagnosis present

## 2024-05-21 DIAGNOSIS — I63412 Cerebral infarction due to embolism of left middle cerebral artery: Principal | ICD-10-CM | POA: Diagnosis present

## 2024-05-21 DIAGNOSIS — R531 Weakness: Secondary | ICD-10-CM

## 2024-05-21 DIAGNOSIS — I69398 Other sequelae of cerebral infarction: Secondary | ICD-10-CM | POA: Diagnosis not present

## 2024-05-21 DIAGNOSIS — R29810 Facial weakness: Secondary | ICD-10-CM | POA: Diagnosis present

## 2024-05-21 DIAGNOSIS — I63512 Cerebral infarction due to unspecified occlusion or stenosis of left middle cerebral artery: Secondary | ICD-10-CM | POA: Diagnosis present

## 2024-05-21 DIAGNOSIS — Z8774 Personal history of (corrected) congenital malformations of heart and circulatory system: Secondary | ICD-10-CM | POA: Diagnosis not present

## 2024-05-21 DIAGNOSIS — I639 Cerebral infarction, unspecified: Secondary | ICD-10-CM | POA: Diagnosis not present

## 2024-05-21 DIAGNOSIS — I69344 Monoplegia of lower limb following cerebral infarction affecting left non-dominant side: Secondary | ICD-10-CM | POA: Diagnosis not present

## 2024-05-21 LAB — CBC
HCT: 38.3 % (ref 36.0–46.0)
Hemoglobin: 11.9 g/dL — ABNORMAL LOW (ref 12.0–15.0)
MCH: 26.9 pg (ref 26.0–34.0)
MCHC: 31.1 g/dL (ref 30.0–36.0)
MCV: 86.5 fL (ref 80.0–100.0)
Platelets: 249 K/uL (ref 150–400)
RBC: 4.43 MIL/uL (ref 3.87–5.11)
RDW: 14.7 % (ref 11.5–15.5)
WBC: 5.2 K/uL (ref 4.0–10.5)
nRBC: 0 % (ref 0.0–0.2)

## 2024-05-21 LAB — BASIC METABOLIC PANEL WITH GFR
Anion gap: 11 (ref 5–15)
BUN: 12 mg/dL (ref 6–20)
CO2: 22 mmol/L (ref 22–32)
Calcium: 9.6 mg/dL (ref 8.9–10.3)
Chloride: 104 mmol/L (ref 98–111)
Creatinine, Ser: 0.86 mg/dL (ref 0.44–1.00)
GFR, Estimated: >60 mL/min
Glucose, Bld: 88 mg/dL (ref 70–99)
Potassium: 4.2 mmol/L (ref 3.5–5.1)
Sodium: 137 mmol/L (ref 135–145)

## 2024-05-21 LAB — PROTIME-INR
INR: 1 (ref 0.8–1.2)
Prothrombin Time: 14 s (ref 11.4–15.2)

## 2024-05-21 MED ORDER — LACTATED RINGERS IV BOLUS: 1000.0000 mL | Freq: Once | INTRAVENOUS | Status: DC

## 2024-05-21 MED ORDER — ACETAMINOPHEN 325 MG PO TABS: 650.0000 mg | ORAL_TABLET | ORAL | Status: DC | PRN

## 2024-05-21 MED ORDER — WARFARIN SODIUM 7.5 MG PO TABS
7.5000 mg | ORAL_TABLET | Freq: Every evening | ORAL | Status: DC
  Administered 2024-05-21 – 2024-05-24 (×4): 7.5 mg via ORAL
  Filled 2024-05-21: qty 1

## 2024-05-21 MED ORDER — BUPROPION HCL ER (XL) 150 MG PO TB24
300.0000 mg | ORAL_TABLET | Freq: Every day | ORAL | Status: DC
  Administered 2024-05-22 – 2024-05-27 (×6): 300 mg via ORAL

## 2024-05-21 MED ORDER — ACETAMINOPHEN 650 MG RE SUPP: 650.0000 mg | RECTAL | Status: DC | PRN

## 2024-05-21 MED ORDER — HEPARIN (PORCINE) 25000 UT/250ML-% IV SOLN
600.0000 [IU]/h | INTRAVENOUS | Status: DC
  Administered 2024-05-21 – 2024-05-23 (×2): 650 [IU]/h via INTRAVENOUS
  Administered 2024-05-25 – 2024-05-27 (×2): 600 [IU]/h via INTRAVENOUS
  Filled 2024-05-21: qty 250

## 2024-05-21 MED ORDER — IOHEXOL 350 MG/ML SOLN
75.0000 mL | Freq: Once | INTRAVENOUS | Status: AC | PRN
  Administered 2024-05-21: 75 mL via INTRAVENOUS

## 2024-05-21 MED ORDER — SOTALOL HCL 120 MG PO TABS
60.0000 mg | ORAL_TABLET | Freq: Every day | ORAL | Status: DC
  Administered 2024-05-21 – 2024-05-26 (×6): 60 mg via ORAL
  Filled 2024-05-21: qty 0.5

## 2024-05-21 MED ORDER — STROKE: EARLY STAGES OF RECOVERY BOOK: Freq: Once | Status: AC

## 2024-05-21 MED ORDER — SERTRALINE HCL 50 MG PO TABS
50.0000 mg | ORAL_TABLET | Freq: Every day | ORAL | Status: DC
  Administered 2024-05-22 – 2024-05-27 (×6): 50 mg via ORAL

## 2024-05-21 MED ORDER — SOTALOL HCL 80 MG PO TABS
40.0000 mg | ORAL_TABLET | Freq: Every day | ORAL | Status: DC
  Administered 2024-05-22 – 2024-05-27 (×6): 40 mg via ORAL

## 2024-05-21 MED ORDER — SENNOSIDES-DOCUSATE SODIUM 8.6-50 MG PO TABS: 1.0000 | ORAL_TABLET | Freq: Every evening | ORAL | Status: DC | PRN

## 2024-05-21 MED ORDER — ACETAMINOPHEN 160 MG/5ML PO SOLN: 650.0000 mg | ORAL | Status: DC | PRN

## 2024-05-21 MED ORDER — SODIUM CHLORIDE 0.9 % IV BOLUS
1000.0000 mL | Freq: Once | INTRAVENOUS | Status: AC
  Administered 2024-05-21: 1000 mL via INTRAVENOUS

## 2024-05-21 MED ORDER — WARFARIN - PHYSICIAN DOSING INPATIENT: Freq: Every day | Status: DC

## 2024-05-21 MED ORDER — ASPIRIN 81 MG PO TBEC
81.0000 mg | DELAYED_RELEASE_TABLET | Freq: Every day | ORAL | Status: DC
  Administered 2024-05-22 – 2024-05-27 (×6): 81 mg via ORAL

## 2024-05-21 MED ORDER — SODIUM CHLORIDE 0.9 % IV SOLN: INTRAVENOUS | Status: DC

## 2024-05-21 NOTE — ED Notes (Signed)
 IV team consult ordered , multiple unsuccessful venipuncture by RN .

## 2024-05-21 NOTE — H&P (Signed)
 NEUROLOGY H&P NOTE   Date of service: May 21, 2024 Patient Name: Peggy Bennett MRN:  [id number] DOB:  [birth year] Chief Complaint: episode of R facial droop and word finding difficulty  History of Present Illness  ADDILYNN MOWRER is a 23 y.o. female with hx of truncus arteriosis s/p repair and mechanical aortic valve on warfarin with goal INR between 2.5-3.5 and s/p ICD in 2017, prior hx of strokes with residual mild L leg weakness and slowed rapid alternating movements in L hand who presents with 2 mins episode of R facial droop and word finding difficulty with slurred speech.  Patient reports INR check 6 days ago was 11. She stopped warfarin and took Vit K. Repeat 2 days later was 9.6. on Friday, her INR on Friday was 1.3 and she resumed her warfarin. Today, she had 2 mins episode of slurred speech, word finding difficulty and R facial droop. This occurred at 2pm while she was talking. Came to the ED for evaluation.  CT head negative, CTA head and neck with near occlusive L MCA M2 thrombus.  She denies any family hx of stroke. She does not smoke, no EtOH, no recreational substances, no hx of DM2, HTN, HLD.    She reports taking Aspirin  in addition to warfarin. She states that the reason she does, is for her hx of stroke. However, review of prior notes from [facility], specifically, note from neurology on 08/30/19 mentions Peds hematology and cardiology have recommended addition of  aspirin   given her mechanical aortic valve per CHEST guidelines and were wondering about risk of bleeding in the setting of her known microbleeds on MRI.  Brain MRI has been personally reviewed and shows minimal scattered microhemorrhages that are likely the result of her previous bypass surgeries.  While additional antithrombotic therapy with aspirin   is not indicated strictly from a stroke prevention standpoint, we do not believe it would be a contraindication if indicated for cardiac reasons.  Although there is no  specific data in the pediatric population, her burden of cerebral microbleeds is minimal and chronic, so we believe the risk of bleed is low in her case. After discussion with Peds Cardiology, Hematology and our pediatric stroke specialist (Dr Sidonie), we have agreed to start  Aspirin .  Last known well: 1400 Modified rankin score: 0-Completely asymptomatic and back to baseline post- stroke IV Thrombolysis: No, no symptoms at this time. Thrombectomy: not offered, no symptoms at this time.  NIHSS components Score: Comment  1a Level of Conscious 0[]  1[]  2[]  3[]      1b LOC Questions 0[]  1[]  2[]       1c LOC Commands 0[]  1[]  2[]       2 Best Gaze 0[]  1[]  2[]       3 Visual 0[]  1[]  2[]  3[]      4 Facial Palsy 0[]  1[]  2[]  3[]      5a Motor Arm - left 0[]  1[]  2[]  3[]  4[]  UN[]    5b Motor Arm - Right 0[]  1[]  2[]  3[]  4[]  UN[]    6a Motor Leg - Left 0[]  1[]  2[]  3[]  4[]  UN[]    6b Motor Leg - Right 0[]  1[]  2[]  3[]  4[]  UN[]    7 Limb Ataxia 0[]  1[]  2[]  UN[]      8 Sensory 0[]  1[]  2[]  UN[]      9 Best Language 0[]  1[]  2[]  3[]      10 Dysarthria 0[]  1[]  2[]  UN[]      11 Extinct. and Inattention 0[]  1[]  2[]       TOTAL: 0  ROS  Comprehensive ROS performed and pertinent positives documented in the HPI  Past History   Past Medical History:  Diagnosis Date   Stroke [facility])    Phreesia 01/16/2020   Truncus arteriosus    Past Surgical History:  Procedure Laterality Date   AORTIC VALVE REPLACEMENT     CARDIAC DEFIBRILLATOR PLACEMENT     CARDIAC VALVE REPLACEMENT N/A    Phreesia 01/16/2020   ICD IMPLANT  2017   TRUNCUS ARTERIOSUS REPAIR     Family History  Problem Relation Age of Onset   Cancer Maternal Grandmother        renal   Renal Disease Maternal Grandfather    Diabetes Father    Hyperlipidemia Mother    Social History   Socioeconomic History   Marital status: Single    Spouse name: Not on file   Number of children: 0   Years of education: Not on file   Highest education level:  Not on file  Occupational History   Not on file  Tobacco Use   Smoking status: Never   Smokeless tobacco: Never  Vaping Use   Vaping status: Never Used  Substance and Sexual Activity   Alcohol use: Not Currently    Comment: social   Drug use: Never   Sexual activity: Not Currently  Other Topics Concern   Not on file  Social History Narrative   In college [employer]. 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      Lives on campus during       Lives with mom and dad in [place] during off season    Social Drivers of Health   Tobacco Use: Low Risk (05/15/2024)   Patient History    Smoking Tobacco Use: Never    Smokeless Tobacco Use: Never    Passive Exposure: Not on file  Financial Resource Strain: Low Risk (10/20/2022)   Received from [facility]   Overall Financial Resource Strain (CARDIA)    Difficulty of Paying Living Expenses: Not hard at all  Food Insecurity: No Food Insecurity (10/20/2022)   Received from [facility]   Epic    Within the past 12 months, you worried that your food would run out before you got the money to buy more.: Never true    Within the past 12 months, the food you bought just didn't last and you didn't have money to get more.: Never true  Transportation Needs: No Transportation Needs (10/20/2022)   Received from [facility]   PRAPARE - Transportation    Lack of Transportation (Medical): No    Lack of Transportation (Non-Medical): No  Physical Activity: Not on file  Stress: Not on file  Social Connections: Unknown (10/07/2021)   Received from [facility]   Social Network    Social Network: Not on file  Depression (PHQ2-9): Medium Risk (12/16/2023)   Depression (PHQ2-9)    PHQ-2 Score: 9  Alcohol Screen: Not on file  Housing: Not on file  Utilities: Low Risk (10/20/2022)   Received from [facility]   Utilities    Within the past 12 months, have you been unable to get utilities(heat, electricity) when it was really  needed?: No  Health Literacy: Not on file   Allergies[1]  Medications  Current Medications[2]   Vitals   Vitals:   05/21/24 1556 05/21/24 2051 05/21/24 2130 05/21/24 2243  BP: 112/76  (!) 160/83 129/82  Pulse: 70  82 86  Resp: 16  17  Temp: 99.6 F (37.6 C)   98.3 F (36.8 C)  TempSrc: Oral   Oral  SpO2: 100% 100% 100% 100%  Weight:      Height:         Body mass index is 17.16 kg/m.  Physical Exam   General: Laying comfortably in bed; in no acute distress.  HENT: Normal oropharynx and mucosa. Normal external appearance of ears and nose.  Neck: Supple, no pain or tenderness  CV: No JVD. No peripheral edema.  Pulmonary: Symmetric Chest rise. Normal respiratory effort.  Abdomen: Soft to touch, non-tender.  Ext: No cyanosis, edema, or deformity  Skin: No rash. Normal palpation of skin.   Musculoskeletal: Normal digits and nails by inspection. No clubbing.   Neurologic Examination  Mental status/Cognition: Alert, oriented to self, place, month and year, good attention.  Speech/language: Fluent, comprehension intact, object naming intact, repetition intact.  Cranial nerves:   CN II Pupils equal and reactive to light, no VF deficits    CN III,IV,VI EOM intact, no gaze preference or deviation, no nystagmus    CN V normal sensation in V1, V2, and V3 segments bilaterally    CN VII no asymmetry, no nasolabial fold flattening    CN VIII normal hearing to speech    CN IX & X normal palatal elevation, no uvular deviation    CN XI 5/5 head turn and 5/5 shoulder shrug bilaterally    CN XII midline tongue protrusion    Motor:  Muscle bulk: normal, tone normal, pronator drift none tremor none Mvmt Root Nerve  Muscle Right Left Comments  SA C5/6 Ax Deltoid 5 5   EF C5/6 Mc Biceps 5 5   EE C6/7/8 Rad Triceps 5 4+   WF C6/7 Med FCR     WE C7/8 PIN ECU     F Ab C8/T1 U ADM/FDI 5 5   HF L1/2/3 Fem Illopsoas 5 4+   KE L2/3/4 Fem Quad 5 5   DF L4/5 D Peron Tib Ant 5 5   PF  S1/2 Tibial Grc/Sol 5 5    Sensation:  Light touch Intact throughout   Pin prick    Temperature    Vibration   Proprioception    Coordination/Complex Motor:  - Finger to Nose intact BL - Heel to shin intact BL - Rapid alternating movement are slowed in L hand and slow L foot tapping. - Gait: deferred.  Labs   CBC:  Recent Labs  Lab 05/15/24 0218 05/21/24 1641  WBC 4.1 5.2  HGB 11.8* 11.9*  HCT 38.2 38.3  MCV 86.4 86.5  PLT 302 249   Basic Metabolic Panel:  Lab Results  Component Value Date   NA 137 05/21/2024   K 4.2 05/21/2024   CO2 22 05/21/2024   GLUCOSE 88 05/21/2024   BUN 12 05/21/2024   CREATININE 0.86 05/21/2024   CALCIUM 9.6 05/21/2024   GFRNONAA >60 05/21/2024   GFRAA NOT CALCULATED 08/21/2019   Lipid Panel: No results found for: LDLCALC HgbA1c: No results found for: HGBA1C Urine Drug Screen: No results found for: LABOPIA, COCAINSCRNUR, LABBENZ, AMPHETMU, THCU, LABBARB  Alcohol Level No results found for: [facility] INR  Lab Results  Component Value Date   INR 1.0 05/21/2024   APTT  Lab Results  Component Value Date   APTT 38 (H) 08/21/2019     CT Head without contrast(Personally reviewed): CTH was negative for a large hypodensity concerning for a large territory infarct or hyperdensity concerning  for an ICH  CT angio Head and Neck with contrast(Personally reviewed): 1. 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  MRI Brain(Personally reviewed): pending Assessment   KAROLINA ZAMOR is a 23 y.o. female with hx of truncus arteriosis s/p repair and mechanical aortic valve on warfarin with goal INR between 2.5-3.5 and s/p ICD in 2017, prior hx of strokes with residual mild L leg weakness and slowed rapid alternating movements in L hand who presents with 2 mins episode of R facial droop and word finding difficulty with slurred speech in the setting of INR of 1  today.  CT Head negative. CTA head and neck with a left MCA proximal M2 intraluminal thrombus with resultant severe stenosis. She has no neurological deficit on exam attributable to the noted thrombus.  Will monitor closely in the Neuro ICU. Will start heparin  gtt and continue aspirin  81mg  daily.  Primary Diagnosis:  Cerebral infarction due to embolism of  left middle cerebral artery.   Recommendations  Acute Left MCA Stroke with L MCA M2 near occlusive thrombus: no neuro deficits noted that are attributable to L MCA M2 occlusive thrombus. Etiology suspected to be subtherapeutic INR in the setting of mechanical aortic valve. - Q1 Hour neuro checks in the Neuro ICU. - Recommend brain imaging with MRI Brain without contrast - no need for TTE from a neuro standpoint. - Recommend obtaining Lipid panel with LDL. Will start statin if LDL > 70 - Recommend HbA1c to evaluate for diabetes and how well it is controlled. - Anticoagulation with heparin  gtt as a bridge until INR therapeutic on warfarin. Will also resume home aspirin . - SBP goal - permissive hypertension first 24 h < 220/110. Held home meds.  - Recommend Telemetry monitoring for arrythmia - Recommend bedside swallow screen prior to PO intake. - Stroke education booklet - Recommend PT/OT/SLP consult - STAT CT Head and CTA head and neck and code stroke activation for noted focal neurologic symptoms given noted L MCA M2 thrombus.  Mechanical aortic valve and truncus arteriosus repair with subtherapeutic INR: - ING goal is 2.5-3.5 - Resume warfarin, heparin  bridge until therapeutic. - continue Aspirin  81mg  daily.  Depression: - continue home bupropion  and sertraline .  Hx of Ventricular tachycardia - continue home Sotalol . ______________________________________________________________________  This patient is critically ill and at significant risk of neurological worsening, death and care requires constant monitoring of vital signs,  hemodynamics,respiratory and cardiac monitoring, neurological assessment, discussion with family, other specialists and medical decision making of high complexity. I spent 60 minutes of neurocritical care time  in the care of  this patient. This was time spent independent of any time provided by nurse practitioner or PA.  Allergies verified and pt has no known allergies. Code status verified and patient is FULL CODE. Patient would like her parents to make medical decisions for her in case she is unable to make medical decisions by herself.  Kalaya Infantino [facility] 05/21/2024  11:20 PM   Signed, Emori Kamau, MD [facility]    [1] No Known Allergies [2]  Current Facility-Administered Medications:    [START ON 05/22/2024]  stroke: early stages of recovery book, , Does not apply, Once, Miche Loughridge, MD   0.9 %  sodium chloride  infusion, , Intravenous, Continuous, Carianna Lague, MD   acetaminophen  (TYLENOL ) tablet 650 mg, 650 mg, Oral, Q4H PRN **OR** acetaminophen  (TYLENOL ) 160 MG/5ML solution 650 mg, 650 mg, Per Tube, Q4H PRN **OR** acetaminophen  (  TYLENOL ) suppository 650 mg, 650 mg, Rectal, Q4H PRN, Raylyn Carton, MD   heparin  ADULT infusion 100 units/mL (25000 units/250mL), 650 Units/hr, Intravenous, Continuous, Gaines Carrier, RPH, Last Rate: 6.5 mL/hr at 05/21/24 2232, 650 Units/hr at 05/21/24 2232   senna-docusate (Senokot-S) tablet 1 tablet, 1 tablet, Oral, QHS PRN, Makaelyn Aponte, MD  Current Outpatient Medications:    aspirin  81 MG chewable tablet, Chew 81 mg by mouth daily., Disp: , Rfl:    buPROPion  (WELLBUTRIN  XL) 300 MG 24 hr tablet, Take 300 mg by mouth daily., Disp: , Rfl:    cyclobenzaprine  (FLEXERIL ) 10 MG tablet, Take 1 tablet (10 mg total) by mouth 2 (two) times daily as needed for muscle spasms., Disp: 20 tablet, Rfl: 0   ondansetron  (ZOFRAN ) 4 MG tablet, Take 1 tablet (4 mg total) by mouth every 6 (six) hours., Disp:  20 tablet, Rfl: 0   sotalol  (BETAPACE ) 80 MG tablet, Take 40 mg by mouth 2 (two) times daily. , Disp: , Rfl:    warfarin (COUMADIN ) 1 MG tablet, Take 7 mg by mouth daily., Disp: , Rfl:

## 2024-05-21 NOTE — ED Provider Notes (Addendum)
 "  EMERGENCY DEPARTMENT AT [facility] Provider Note   HPI/ROS    History obtained from patient.  Peggy Bennett is a 23 y.o. female who presents for No chief complaint on file. and who  has a past medical history of Stroke [facility]) and Truncus arteriosus.  Patient presents today after having an episode of roughly 2 minutes of slurred speech and facial droop at home.  Patient states that around 2 PM today she had the symptoms and they quickly resolved.  States she has been off of warfarin for the last 5 days, but restarted it yesterday.  Endorses mild headache now, but otherwise is asymptomatic.  Denies any fevers, chills, chest pain or shortness breath, nausea, vomiting, diarrhea.  Has a history of a valve replacement which is why she is currently on warfarin.   MDM   I have reviewed the nursing documentation, vital signs, as well as the past medical history, surgical history, family history, and social history.  Initial Assessment:  Hemodynamically stable on initial evaluation with no signs of focal neurologic deficit at this time.  Neurology already involved in patient care and recommending CT angio and heparin  drip.  BMP and CBC here unremarkable aside from chronic stable anemia.  NR of 1 currently, which is concerning given patient is supposed to be therapeutic at 2-3.  EKG here with right bundle branch block with no other obvious ischemia, dysrhythmia, or high-grade AV block.  Technically poor EKG.  CTA with almost complete occlusion of M2 MCA with intramural thrombosis.  Not a candidate for thrombectomy at this time given asymptomatic and roughly 10% bleed risk with thrombectomy.  Neurology will admit to ICU under them for q1h neurochecks.  No other emergent intervention required in the ED. patient admitted under Dr.Khaliqdina for further workup and management.  Disposition:  I discussed the case with Dr.Khaliqdina who graciously agreed to admit the patient to their service  for continued care.    This patient was staffed with Dr. Francesca who supervised the visit and agreed with the plan of care.   Due to the patients current presenting symptoms, physical exam findings, and the workup stated above, it is thought that the etiology of the patients current presentation is:  1. TIA (transient ischemic attack)      Clinical Complexity A medically appropriate history, review of systems, and physical exam was performed.  Factors that affect the complexity of this encounter: assessment of correct protocol, laboratory work from this visit, notes from other physicians (Neurology), and review of echocardiogram/EKG results  My independent interpretations of diagnostic studies are documented in the ED course above.   If decision rules were used in this patient's evaluation, they are listed below.   Click here for ABCD2, HEART and other calculators  Patient's presentation is most consistent with acute presentation with potential threat to life or bodily function.  MDM generated using voice dictation software and may contain dictation errors. Please contact me for any clarification or with any questions.    Physical Exam, PMH, PSH, Family History, and Social Hsitory   Vitals:   05/21/24 1552 05/21/24 1556 05/21/24 2051 05/21/24 2130  BP:  112/76  (!) 160/83  Pulse:  70  82  Resp:  16    Temp:  99.6 F (37.6 C)    TempSrc:  Oral    SpO2:  100% 100% 100%  Weight: 45.4 kg     Height: 5' 4 (1.626 m)  Physical Exam Constitutional:      Appearance: Normal appearance.  HENT:     Head: Normocephalic and atraumatic.     Mouth/Throat:     Mouth: Mucous membranes are moist.     Pharynx: Oropharynx is clear.  Eyes:     Extraocular Movements: Extraocular movements intact.     Conjunctiva/sclera: Conjunctivae normal.     Pupils: Pupils are equal, round, and reactive to light.  Cardiovascular:     Rate and Rhythm: Normal rate and regular rhythm.   Pulmonary:     Effort: Pulmonary effort is normal.     Breath sounds: Normal breath sounds. No wheezing, rhonchi or rales.  Abdominal:     General: Abdomen is flat.     Palpations: Abdomen is soft.     Tenderness: There is no abdominal tenderness. There is no guarding or rebound.  Musculoskeletal:        General: Normal range of motion.     Right lower leg: No edema.     Left lower leg: No edema.  Skin:    General: Skin is warm and dry.     Capillary Refill: Capillary refill takes less than 2 seconds.  Neurological:     General: No focal deficit present.     Mental Status: She is alert and oriented to person, place, and time.     GCS: GCS eye subscore is 4. GCS verbal subscore is 5. GCS motor subscore is 6.     Cranial Nerves: Cranial nerves 2-12 are intact.     Sensory: Sensation is intact.     Motor: Motor function is intact.     Comments: 5 out of 5 strength in all extremities.  Intact sensation.     Past Medical History:  Diagnosis Date   Stroke [facility])    Phreesia 01/16/2020   Truncus arteriosus      Past Surgical History:  Procedure Laterality Date   AORTIC VALVE REPLACEMENT     CARDIAC DEFIBRILLATOR PLACEMENT     CARDIAC VALVE REPLACEMENT N/A    Phreesia 01/16/2020   ICD IMPLANT  2017   TRUNCUS ARTERIOSUS REPAIR       Family History  Problem Relation Age of Onset   Cancer Maternal Grandmother        renal   Renal Disease Maternal Grandfather    Diabetes Father    Hyperlipidemia Mother     Social History   Tobacco Use   Smoking status: Never   Smokeless tobacco: Never  Substance Use Topics   Alcohol use: Not Currently    Comment: social     Procedures   If procedures were preformed on this patient, they are listed below:  Procedures   Electronically signed by:   Glendia Carlin Ancona, M.D. PGY-2, Emergency Medicine   Please note that this documentation was produced with the assistance of voice-to-text technology and may contain errors.     Ancona Glendia, MD 05/21/24 7763    Ancona Glendia, MD 05/21/24 2251    Francesca Elsie CROME, MD 05/21/24 2321  "

## 2024-05-21 NOTE — ED Notes (Signed)
 Pt's father at bedside states he would like to take pt to [facility] where her primary care is located. Father states he will notify staff if they decide to leave. Explained the risks of leaving against medical advice, plan of care discussed.

## 2024-05-21 NOTE — Progress Notes (Signed)
 PHARMACY - ANTICOAGULATION CONSULT NOTE  Pharmacy Consult for heparin  Indication: mAVR, CVA  Allergies[1]  Patient Measurements: Height: 5' 4 (162.6 cm) Weight: 45.4 kg (100 lb) IBW/kg (Calculated) : 54.7 HEPARIN  DW (KG): 45.4  Vital Signs: Temp: 99.6 F (37.6 C) (12/28 1556) Temp Source: Oral (12/28 1556) BP: 112/76 (12/28 1556) Pulse Rate: 70 (12/28 1556)  Labs: Recent Labs    05/21/24 1641  HGB 11.9*  HCT 38.3  PLT 249  LABPROT 14.0  INR 1.0  CREATININE 0.86    Estimated Creatinine Clearance: 74.2 mL/min (by C-G formula based on SCr of 0.86 mg/dL).   Medical History: Past Medical History:  Diagnosis Date   Stroke [facility])    Phreesia 01/16/2020   Truncus arteriosus      Assessment: 63 YOF presenting with TIA, hx of aortic valve replacement on warfarin PTA, INR subtherapeutic at 1 on admission and to start heparin  gtt, avoid bolus for now with current stroke workup  Goal of Therapy:  Heparin  level 0.3-0.5 units/ml Monitor platelets by anticoagulation protocol: Yes   Plan:  Heparin  gtt at 650 units/hr, no bolus F/u 6 hour heparin  level F/u INR and AC plan  Dorn Poot, PharmD, [facility] Clinical Pharmacist ED Pharmacist Phone # [phone] 05/21/2024 9:22 PM       [1] No Known Allergies

## 2024-05-21 NOTE — ED Notes (Signed)
 IV team at bedside

## 2024-05-21 NOTE — ED Notes (Signed)
 Patients mother Peggy Bennett called to say that the patient has a dangerously low INR

## 2024-05-21 NOTE — ED Notes (Signed)
 Patient transported to CT

## 2024-05-21 NOTE — ED Triage Notes (Signed)
 Patient arrives via [facility] for TIA, hx of prior stroke. Hx of mechanical heart valve-- takes warfarin but has not taken due to accidental overdose. Facial droop and slurred speech lasting 2 minutes. Symptoms resolved  EMS vitals 108/64 HR 74 12 lead right bundle 98 on room air CBG 128

## 2024-05-21 NOTE — ED Provider Triage Note (Signed)
 Emergency Medicine Provider Triage Evaluation Note  Peggy Bennett , a 23 y.o. female  was evaluated in triage.  Pt complains of feeling that speech was slurred for 2-3 minutes while talking earlier today. No headache. No numbness/weakness. No hx same. Currently feels fine.   Review of Systems  Positive: ?transient altered speech Negative: Headache. Cp. Sob. No numbness/weakness.   Physical Exam  BP 112/76 (BP Location: Right Arm)   Pulse 70   Temp 99.6 F (37.6 C) (Oral)   Resp 16   Ht 1.626 m (5' 4)   Wt 45.4 kg   SpO2 100%   BMI 17.16 kg/m  Gen:   Awake, no distress   Resp:  Normal effort  MSK:   Motor/sens grossly intact. Speech normal. No dysarthria or aphasia noted.  neck:  No bruits  Medical Decision Making  Medically screening exam initiated at 4:13 PM.  Appropriate orders placed.  Peggy Bennett was informed that the remainder of the evaluation will be completed by another provider, this initial triage assessment does not replace that evaluation, and the importance of remaining in the ED until their evaluation is complete.   Pt with hx truncus arteriosus as newborn s/p repair, has mechanical valve, on coumadin . Hx prior cva.   Neurology consulted.   Dr Merrianne called back at ~ 1735, discussed pt, prior history, etc, he indicates order cta head/neck and admit to medicine for tia workup. Imaging ordered.   Labs/imaging ordered.    Peggy Drivers, MD 05/21/24 [phone]

## 2024-05-22 ENCOUNTER — Inpatient Hospital Stay (HOSPITAL_COMMUNITY)

## 2024-05-22 DIAGNOSIS — R29700 NIHSS score 0: Secondary | ICD-10-CM | POA: Diagnosis not present

## 2024-05-22 DIAGNOSIS — G459 Transient cerebral ischemic attack, unspecified: Secondary | ICD-10-CM | POA: Diagnosis not present

## 2024-05-22 DIAGNOSIS — Z7901 Long term (current) use of anticoagulants: Secondary | ICD-10-CM

## 2024-05-22 DIAGNOSIS — I639 Cerebral infarction, unspecified: Secondary | ICD-10-CM | POA: Diagnosis not present

## 2024-05-22 DIAGNOSIS — R791 Abnormal coagulation profile: Secondary | ICD-10-CM | POA: Diagnosis not present

## 2024-05-22 DIAGNOSIS — I63412 Cerebral infarction due to embolism of left middle cerebral artery: Secondary | ICD-10-CM | POA: Diagnosis not present

## 2024-05-22 LAB — LIPID PANEL
Cholesterol: 164 mg/dL (ref 0–200)
HDL: 65 mg/dL
LDL Cholesterol: 89 mg/dL (ref 0–99)
Total CHOL/HDL Ratio: 2.5 ratio
Triglycerides: 51 mg/dL
VLDL: 10 mg/dL (ref 0–40)

## 2024-05-22 LAB — PROTIME-INR
INR: 1.3 — ABNORMAL HIGH (ref 0.8–1.2)
Prothrombin Time: 16.6 s — ABNORMAL HIGH (ref 11.4–15.2)

## 2024-05-22 LAB — HEMOGLOBIN A1C
Hgb A1c MFr Bld: 5 % (ref 4.8–5.6)
Mean Plasma Glucose: 96.8 mg/dL

## 2024-05-22 LAB — HEPARIN LEVEL (UNFRACTIONATED): Heparin Unfractionated: 0.33 [IU]/mL (ref 0.30–0.70)

## 2024-05-22 LAB — MRSA NEXT GEN BY PCR, NASAL: MRSA by PCR Next Gen: NOT DETECTED

## 2024-05-22 LAB — HIV ANTIBODY (ROUTINE TESTING W REFLEX): HIV Screen 4th Generation wRfx: NONREACTIVE

## 2024-05-22 MED ORDER — CHLORHEXIDINE GLUCONATE CLOTH 2 % EX PADS
6.0000 | MEDICATED_PAD | Freq: Every day | CUTANEOUS | Status: DC
  Administered 2024-05-22 – 2024-05-23 (×2): 6 via TOPICAL

## 2024-05-22 MED ORDER — WARFARIN - PHARMACIST DOSING INPATIENT: Freq: Every day | Status: DC

## 2024-05-22 NOTE — Progress Notes (Signed)
 PT Cancellation Note  Patient Details Name: Peggy Bennett MRN: [id number] DOB: 2001/04/02   Cancelled Treatment:    Reason Eval/Treat Not Completed: PT screened, no needs identified, will sign off (Evaluated by OT who reports pt is at her independent baseline)  Peggy Bennett, PT, DPT Acute Rehabilitation Services Office [phone]    Peggy Bennett 05/22/2024, 12:27 PM

## 2024-05-22 NOTE — Progress Notes (Signed)
 TCD bubble study has been completed.   Results can be found under chart review under CV PROC. 05/22/2024 2:46 PM Jackline Castilla RVT, RDMS

## 2024-05-22 NOTE — Progress Notes (Addendum)
 PHARMACY - ANTICOAGULATION CONSULT NOTE  Pharmacy Consult for heparin  + warfarin Indication: mAVR, CVA  Allergies[1]  Patient Measurements: Height: 5' 4 (162.6 cm) Weight: 45.4 kg (100 lb) IBW/kg (Calculated) : 54.7 HEPARIN  DW (KG): 45.4  Vital Signs: Temp: 98.9 F (37.2 C) (12/29 0721) Temp Source: Oral (12/28 2350) BP: 118/72 (12/29 0818) Pulse Rate: 73 (12/29 0818)  Labs: Recent Labs    05/21/24 1641 05/22/24 0742  HGB 11.9*  --   HCT 38.3  --   PLT 249  --   LABPROT 14.0 16.6*  INR 1.0 1.3*  HEPARINUNFRC  --  0.33  CREATININE 0.86  --     Estimated Creatinine Clearance: 74.2 mL/min (by C-G formula based on SCr of 0.86 mg/dL).   Medical History: Past Medical History:  Diagnosis Date   Stroke [facility])    Phreesia 01/16/2020   Truncus arteriosus      Assessment: 72 YOF presenting with TIA, hx of aortic valve replacement on warfarin PTA, INR subtherapeutic at 1.0 on admission and to start heparin  gtt, avoid bolus for now with current stroke workup  PTA warfarin regimen - 7.5 mg daily. Of note, pt has home INR machine HL 0.33 therapeutic  Goal of Therapy:  Heparin  level 0.3-0.5 units/ml Monitor platelets by anticoagulation protocol: Yes   Plan:  Continue heparin  gtt at 650 units/hr, no bolus -plan continue heparin  gtt until INR is therpeutic  Warfarin 7.5mg  daily order entered as well F/u INR daily, CBC  F/u s/sx bleeding    Sharyne Glatter, PharmD, BCCCP Critical Care Clinical Pharmacist 05/22/2024 8:56 AM    [1] No Known Allergies

## 2024-05-22 NOTE — Progress Notes (Signed)
" °  Device system confirmed to be MRI conditional, with implant date > 6 weeks ago, and no evidence of abandoned or epicardial leads in review of most recent CXR  Device last cleared by EP Provider: [place], on 05/22/24  Clearance is good through for 1 year as long as parameters remain stable at time of check. If pt undergoes a cardiac device procedure during that time, they should be re-cleared.   Tachy-therapies to be programmed off if applicable with device back to pre-MRI settings after completion of exam.  [employer] - Programming recommendation received through [employer] App/Tablet  Tobias LITTIE Leander, RT  05/22/2024 4:58 PM     "

## 2024-05-22 NOTE — Evaluation (Signed)
 Occupational Therapy Evaluation Patient Details Name: Peggy Bennett MRN: [id number] DOB: [birth year] Today's Date: 05/22/2024   History of Present Illness   Peggy Bennett is a 23 y.o. female who presented after a 2 minute episode of slurred speech and facial droop. Found to be subtherapeutic INR after recent accidental overdose and M2 occlusion. PMHx: aortic valve replacement on warfarin     Clinical Impressions Peggy Bennett was evaluated s/p the above admission list. She is a archivist and indep at baseline. Upon evaluation, pt demonstrated indep ability to complete mobility and ADLs. Pt does not require further acute, or follow up OT services. Recommend discharge back to pt's environment with assist as needed. OT to sign off with appreciation of order, please re-consult if needed.       If plan is discharge home, recommend the following:   Assist for transportation     Functional Status Assessment   Patient has had a recent decline in their functional status and demonstrates the ability to make significant improvements in function in a reasonable and predictable amount of time.     Equipment Recommendations   None recommended by OT      Precautions/Restrictions   Precautions Precautions: Fall Recall of Precautions/Restrictions: Intact Restrictions Weight Bearing Restrictions Per Provider Order: No     Mobility Bed Mobility Overal bed mobility: Independent                  Transfers Overall transfer level: Independent                        Balance Overall balance assessment: Independent           ADL either performed or assessed with clinical judgement   ADL Overall ADL's : Independent                   Vision Baseline Vision/History: 0 No visual deficits Vision Assessment?: No apparent visual deficits     Perception Perception: Within Functional Limits       Praxis Praxis: WFL       Pertinent Vitals/Pain Pain  Assessment Pain Assessment: No/denies pain     Extremity/Trunk Assessment Upper Extremity Assessment Upper Extremity Assessment: Overall WFL for tasks assessed (L slightly weaker than R (chronic))   Lower Extremity Assessment Lower Extremity Assessment: Overall WFL for tasks assessed   Cervical / Trunk Assessment Cervical / Trunk Assessment: Normal   Communication Communication Communication: No apparent difficulties   Cognition Arousal: Alert Behavior During Therapy: WFL for tasks assessed/performed Cognition: No apparent impairments               Following commands: Intact       Cueing  General Comments   Cueing Techniques: Verbal cues  VSS    Home Living Family/patient expects to be discharged to:: Private residence Living Arrangements: Alone;Parent (parents at home, but pt is at a college apt) Available Help at Discharge: Family;Friend(s);Available 24 hours/day;Available PRN/intermittently Type of Home: Apartment Home Access: Elevator     Home Layout: One level     Bathroom Shower/Tub: Producer, Television/film/video: Standard     Home Equipment: None          Prior Functioning/Environment Prior Level of Function : Independent/Modified Independent             Mobility Comments: indep ADLs Comments: archivist and works    OT Problem List: Decreased activity tolerance   OT Treatment/Interventions:  OT Goals(Current goals can be found in the care plan section)   Acute Rehab OT Goals Patient Stated Goal: home OT Goal Formulation: With patient Time For Goal Achievement: 06/05/24 Potential to Achieve Goals: Good   AM-PAC OT 6 Clicks Daily Activity     Outcome Measure Help from another person eating meals?: None Help from another person taking care of personal grooming?: None Help from another person toileting, which includes using toliet, bedpan, or urinal?: None Help from another person bathing (including washing,  rinsing, drying)?: None Help from another person to put on and taking off regular upper body clothing?: None Help from another person to put on and taking off regular lower body clothing?: None 6 Click Score: 24   End of Session Nurse Communication: Mobility status  Activity Tolerance: Patient tolerated treatment well Patient left: in bed;with call bell/phone within reach;with bed alarm set;with family/visitor present  OT Visit Diagnosis: Muscle weakness (generalized) (M62.81)                Time: 8789-8775 OT Time Calculation (min): 14 min Charges:  OT General Charges $OT Visit: 1 Visit OT Evaluation $OT Eval Low Complexity: 1 Low  [name], OTR/L Acute Rehabilitation Services Office [phone] Secure Chat Communication Preferred   [name] 05/22/2024, 1:35 PM

## 2024-05-22 NOTE — Progress Notes (Addendum)
 STROKE TEAM PROGRESS NOTE    SIGNIFICANT HOSPITAL EVENTS  12/29 early a.m: 2-minute episode of right facial droop and word finding difficulty with slurred speech, INR 1.  CTh negative  CTA showed left M2 thrombus with resultant severe stenosis.  Admitted for continued close monitoring in neuro ICU and started on heparin  drip.   INTERIM HISTORY/SUBJECTIVE Patient with transient episode of slurred speech and right facial droop yesterday.  She has remained stable overnight on IV heparin  without recurrent symptoms.  Blood pressure adequately controlled. Mother at bedside.  RN at bedside. Pending MRI Patient now on diet, will restart p.o. warfarin.  INR this morning 1.3. NIH 0 on exam.  Continue close monitoring and every neurochecks. Okay to work with PT/OT, ambulate with assistance.  OBJECTIVE  CBC    Component Value Date/Time   WBC 5.2 05/21/2024 1641   RBC 4.43 05/21/2024 1641   HGB 11.9 (L) 05/21/2024 1641   HCT 38.3 05/21/2024 1641   PLT 249 05/21/2024 1641   MCV 86.5 05/21/2024 1641   MCH 26.9 05/21/2024 1641   MCHC 31.1 05/21/2024 1641   RDW 14.7 05/21/2024 1641   LYMPHSABS 0.6 (L) 10/18/2020 0623   MONOABS 0.5 10/18/2020 0623   EOSABS 0.1 10/18/2020 0623   BASOSABS 0.0 10/18/2020 0623    BMET    Component Value Date/Time   NA 137 05/21/2024 1641   K 4.2 05/21/2024 1641   CL 104 05/21/2024 1641   CO2 22 05/21/2024 1641   GLUCOSE 88 05/21/2024 1641   BUN 12 05/21/2024 1641   CREATININE 0.86 05/21/2024 1641   CALCIUM 9.6 05/21/2024 1641   GFRNONAA >60 05/21/2024 1641    IMAGING past 24 hours DG Chest Port 1 View Result Date: 05/22/2024 EXAM: 1 VIEW(S) XRAY OF THE CHEST 05/22/2024 09:02:12 AM COMPARISON: 10/04/2003 CLINICAL HISTORY: Neuro deficit, acute, stroke suspected; 810434 Pacemaker [phone] FINDINGS: LINES, TUBES AND DEVICES: Left chest wall dual-lead pacemaker/AICD. LUNGS AND PLEURA: No focal pulmonary opacity. No pleural effusion. No pneumothorax. HEART AND  MEDIASTINUM: Sequelae of prior cardiac valve replacement. Unchanged cardiomegaly. BONES AND SOFT TISSUES: Intact sternotomy sutures. Mild thoracic dextroscoliosis. No acute osseous abnormality. IMPRESSION: 1. No acute airspace disease. Unchanged cardiomegaly. Electronically signed by: Michaeline Blanch MD 05/22/2024 09:47 AM EST RP Workstation: [id number]   CT Angio Head Neck W WO CM Result Date: 05/21/2024 EXAM: CTA HEAD AND NECK WITH AND WITHOUT 05/21/2024 10:15:58 PM TECHNIQUE: CTA of the head and neck was performed with and without the administration of intravenous contrast. Multiplanar 2D and/or 3D reformatted images are provided for review. Automated exposure control, iterative reconstruction, and/or weight based adjustment of the mA/kV was utilized to reduce the radiation dose to as low as reasonably achievable. Stenosis of the internal carotid arteries measured using NASCET criteria. COMPARISON: None available CLINICAL HISTORY: Neuro deficit, acute, stroke suspected Neuro deficit, acute, stroke suspected FINDINGS: AORTIC ARCH AND ARCH VESSELS: No dissection or arterial injury. No significant stenosis of the brachiocephalic or subclavian arteries. CERVICAL CAROTID ARTERIES: No dissection, arterial injury, or hemodynamically significant stenosis by NASCET criteria. CERVICAL VERTEBRAL ARTERIES: No visible dissection, arterial injury, or significant stenosis. Limited assessment of the left vertebral artery due to extensive streak artifact from refluxed venous contrast . LUNGS AND MEDIASTINUM: Unremarkable. SOFT TISSUES: No acute abnormality. BONES: No acute abnormality. ANTERIOR CIRCULATION: No significant stenosis of the internal carotid arteries. No significant stenosis of the anterior cerebral arteries. Bilateral M1 MCAs are patent. Right M2 MCA is patent without significant stenosis. Intraluminal thrombus within a  proximal to mid left M2 MCA branch with resulting severe steno No aneurysm. POSTERIOR  CIRCULATION: No significant stenosis of the posterior cerebral arteries. No significant stenosis of the basilar artery. No significant stenosis of the vertebral arteries. No aneurysm. OTHER: No dural venous sinus thrombosis on this non-dedicated study. IMPRESSION: 1. 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 Findings discussed with Dr. Guillermina via telephone at 10:29 PM. Electronically signed by: Gilmore Molt 05/21/2024 10:30 PM EST RP Workstation: [id number]   CT HEAD WO CONTRAST ( ) Result Date: 05/21/2024 EXAM: CT HEAD WITHOUT CONTRAST 05/21/2024 09:03:33 PM TECHNIQUE: CT of the head was performed without the administration of intravenous contrast. Automated exposure control, iterative reconstruction, and/or weight based adjustment of the mA/kV was utilized to reduce the radiation dose to as low as reasonably achievable. COMPARISON: Comparison made with a head CT from 08/21/2019. CLINICAL HISTORY: Neuro deficit, acute, stroke suspected. Slurred speech with facial droop earlier tonight, now resolved. FINDINGS: BRAIN AND VENTRICLES: No acute hemorrhage. There is a subcentimeter chronic-appearing lacunar infarct in the genu of the left internal capsule. Approximately 1.2 cm chronic-appearing lacunar infarct posteriorly in the underlying lentiform nucleus. Both are new from 2021 but do not appear recent. There is mild cortical volume loss in the cerebral hemispheres, but it is greater than expected for age. No cortical based acute infarct, hemorrhage, or mass is seen. Cerebellum and brainstem are unremarkable. No hydrocephalus. No extra-axial collection. No mass effect or midline shift. ORBITS: No acute abnormality. SINUSES: No acute abnormality. SOFT TISSUES AND SKULL: No acute soft tissue abnormality. No skull fracture. IMPRESSION: 1. No acute intracranial CT findings. 2. Chronic-appearing lacunar infarcts in the genu of the left  internal capsule and in the posterior lentiform nucleus, new from 2021 but not appearing recent. 3. Mild cortical volume loss, greater than expected for age. Electronically signed by: Francis Quam MD 05/21/2024 10:02 PM EST RP Workstation: [id number]    Vitals:   05/22/24 0818 05/22/24 0900 05/22/24 1000 05/22/24 1100  BP: 118/72 114/61 98/60 (!) 111/46  Pulse: 73 71 65 73  Resp: 20 13 (!) 22 (!) 22  Temp:      TempSrc:      SpO2: 100% 100% 99% 100%  Weight:      Height:        PHYSICAL EXAM General:  Alert, well-nourished, well-developed patient in no acute distress Psych:  Mood and affect appropriate for situation CV: Regular rate and rhythm on monitor Respiratory:  Regular, unlabored respirations on room air GI: Abdomen soft and nontender  NEURO:  Mental Status: AA&Ox3, patient is able to give clear and coherent history Speech/Language: speech is without  aphasia.  Naming, repetition, fluency, and comprehension intact.  Cranial Nerves:  II: PERRL. Visual fields full.  III, IV, VI: EOMI. Eyelids elevate symmetrically.  V: Sensation is intact to light touch and symmetrical to face.  VII: Face is symmetrical resting and smiling VIII: hearing intact to voice. IX, X: Palate elevates symmetrically.  No dysarthria KP:Dynloizm shrug 5/5. XII: tongue is midline Motor: 5/5 strength to all muscle groups tested.  Tone: is normal and bulk is normal Sensation- Intact to light touch bilaterally.  Coordination: FTN intact bilaterally, HKS: no ataxia in BLE.Slight decrease in fine motor movements on left Gait- deferred  Most Recent NIH 0.    ASSESSMENT/PLAN  Ms. AZIA TOUTANT is a 23 y.o. 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 due to 2-minute episode of  right facial droop and word finding difficulty with slurred speech, INR 1.  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  Admitted for continued close monitoring in neuro ICU and started on heparin  drip.  NIH on Admission 0.  Transient right facial droop and slurred speech likely due to small left MCA branch cerebral infarction Etiology: Left MCA  near complete occlusion likely due to embolism from mechanical heart valve with suboptimal anticoagulation  CT head  No acute intracranial CT findings. Chronic-appearing lacunar infarcts in the genu of the left internal capsule and in the posterior lentiform nucleus, new from 2021 but not appearing recent. Mild cortical volume loss, greater than expected for age. 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  EF 79%. LDL 89 HgbA1c 5.0 VTE prophylaxis -heparin  IV warfarin daily prior to admission, now on heparin  IV and warfarin daily. Therapy recommendations:  Pending Disposition:  pending  Hx of Stroke/TIA 12/2019  S/p AV replacement S/p Truncus Arteriosus repair Home Meds: Warfarin 7.5mg  Daily, sotalol  80mg  BID INR: 1.0--1.3 INR was noted to be 11.07 days ago.  Patient was told to stop her Coumadin , given vitamin K.  She had not restarted her Coumadin . Per patient, she checks her INR independently with strips, at home.  Begin anticoagulation with Heparin  IV, Warfarin   Hyperlipidemia Home meds:  none LDL 89, goal < 70 No statin due to possibility of interaction with Coumadin    Hospital day # 1   Pt seen by Neuro NP/APP with MD. Note/plan to be edited by MD as needed.    Rocky JAYSON Likes, DNP [facility] Please use AMION for contact information & EPIC for messaging.  I have personally obtained  history,examined this patient, reviewed notes, independently viewed imaging studies, participated in medical decision making and plan of care.ROS completed by me personally and pertinent positives fully documented  I have made any additions or clarifications directly to the above note. Agree with note above.  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  Plan to monitor closely in the ICU tonight if there is significant neurological worsening may consider mechanical thrombectomy but if stable will move out of the ICU tomorrow.  Continue IV heparin  and start oral warfarin.  If MRI shows no significant stroke may discharge home tomorrow on Lovenox  bridge with warfarin.  Plan to check TCD bubble study follow-up right-to-left shunt later today.  Long discussion patient and mother at the bedside and answered questions. This patient is critically ill and at significant risk of neurological worsening, death and care requires constant monitoring of vital signs, hemodynamics,respiratory and cardiac monitoring, extensive review of multiple databases, frequent neurological assessment, discussion with family, other specialists and medical decision making of high complexity.I have made any additions or clarifications directly to the above note.This critical care time does not reflect procedure time, or teaching time or supervisory time of PA/NP/Med Resident etc but could involve care discussion time.  I spent  30 minutes of neurocritical care time  in the care of  this patient.     Eather Popp, MD Medical Director [facility] Pager: [phone] 05/22/2024 4:36 PM  To contact Stroke Continuity provider, please refer to [email or web address]. After hours, contact General Neurology

## 2024-05-23 DIAGNOSIS — Z952 Presence of prosthetic heart valve: Secondary | ICD-10-CM | POA: Diagnosis not present

## 2024-05-23 DIAGNOSIS — R791 Abnormal coagulation profile: Secondary | ICD-10-CM | POA: Diagnosis not present

## 2024-05-23 DIAGNOSIS — I63412 Cerebral infarction due to embolism of left middle cerebral artery: Secondary | ICD-10-CM | POA: Diagnosis not present

## 2024-05-23 DIAGNOSIS — R29700 NIHSS score 0: Secondary | ICD-10-CM | POA: Diagnosis not present

## 2024-05-23 DIAGNOSIS — Z7901 Long term (current) use of anticoagulants: Secondary | ICD-10-CM | POA: Diagnosis not present

## 2024-05-23 LAB — CBC
HCT: 36.3 % (ref 36.0–46.0)
Hemoglobin: 11.5 g/dL — ABNORMAL LOW (ref 12.0–15.0)
MCH: 27.1 pg (ref 26.0–34.0)
MCHC: 31.7 g/dL (ref 30.0–36.0)
MCV: 85.6 fL (ref 80.0–100.0)
Platelets: 187 K/uL (ref 150–400)
RBC: 4.24 MIL/uL (ref 3.87–5.11)
RDW: 15.3 % (ref 11.5–15.5)
WBC: 3.6 K/uL — ABNORMAL LOW (ref 4.0–10.5)
nRBC: 0 % (ref 0.0–0.2)

## 2024-05-23 LAB — BASIC METABOLIC PANEL WITH GFR
Anion gap: 14 (ref 5–15)
BUN: 9 mg/dL (ref 6–20)
CO2: 15 mmol/L — ABNORMAL LOW (ref 22–32)
Calcium: 9.6 mg/dL (ref 8.9–10.3)
Chloride: 105 mmol/L (ref 98–111)
Creatinine, Ser: 0.82 mg/dL (ref 0.44–1.00)
GFR, Estimated: >60 mL/min
Glucose, Bld: 74 mg/dL (ref 70–99)
Potassium: 4.2 mmol/L (ref 3.5–5.1)
Sodium: 134 mmol/L — ABNORMAL LOW (ref 135–145)

## 2024-05-23 LAB — MAGNESIUM: Magnesium: 2.1 mg/dL (ref 1.7–2.4)

## 2024-05-23 LAB — HEPARIN LEVEL (UNFRACTIONATED): Heparin Unfractionated: 0.47 [IU]/mL (ref 0.30–0.70)

## 2024-05-23 LAB — PROTIME-INR
INR: 1.4 — ABNORMAL HIGH (ref 0.8–1.2)
Prothrombin Time: 17.4 s — ABNORMAL HIGH (ref 11.4–15.2)

## 2024-05-23 NOTE — Progress Notes (Signed)
 Received from [facility] [address] accompanied by [employer] and her mother around 19.Head to toe assessment done. Vitals were monitored, NIH stroke scale done. Orientation to new environment done.

## 2024-05-23 NOTE — Progress Notes (Signed)
 PHARMACY - ANTICOAGULATION CONSULT NOTE  Pharmacy Consult for heparin  + warfarin Indication: mAVR, CVA  Allergies[1]  Patient Measurements: Height: 5' 4 (162.6 cm) Weight: 45.4 kg (100 lb) IBW/kg (Calculated) : 54.7 HEPARIN  DW (KG): 45.4  Vital Signs: Temp: 98.7 F (37.1 C) (12/30 0400) Temp Source: Oral (12/30 0400) BP: 103/55 (12/30 0600) Pulse Rate: 67 (12/30 0600)  Labs: Recent Labs    05/21/24 1641 05/22/24 0742 05/23/24 0540  HGB 11.9*  --  11.5*  HCT 38.3  --  36.3  PLT 249  --  187  LABPROT 14.0 16.6* 17.4*  INR 1.0 1.3* 1.4*  HEPARINUNFRC  --  0.33 0.47  CREATININE 0.86  --  0.82    Estimated Creatinine Clearance: 77.8 mL/min (by C-G formula based on SCr of 0.82 mg/dL).   Medical History: Past Medical History:  Diagnosis Date   Stroke [facility])    Phreesia 01/16/2020   Truncus arteriosus      Assessment: 41 YOF presenting with TIA, hx of aortic valve replacement on warfarin PTA, INR subtherapeutic at 1.0 on admission and to start heparin  gtt, avoid bolus for now with current stroke workup  PTA warfarin regimen - 7.5 mg daily. Of note, pt has home INR machine HL 0.47- therapeutic; INR 1.4 - subtherapeutic   CBC this AM is stable  Goal of Therapy:  Heparin  level 0.3-0.5 units/ml Monitor platelets by anticoagulation protocol: Yes   Plan:  Continue heparin  gtt at 650 units/hr -plan to continue heparin  gtt until INR is therapeutic  Continue with warfarin 7.5mg  daily order entered  F/u INR daily, CBC ; f/u s/sx bleeding   Sharyne Glatter, PharmD, BCCCP Critical Care Clinical Pharmacist 05/23/2024 8:23 AM    [1] No Known Allergies

## 2024-05-23 NOTE — Progress Notes (Addendum)
 STROKE TEAM PROGRESS NOTE    SIGNIFICANT HOSPITAL EVENTS  12/29 early a.m: 2-minute episode of right facial droop and word finding difficulty with slurred speech, INR 1.  CTh negative  CTA showed left M2 thrombus with resultant severe stenosis.  Admitted for continued close monitoring in neuro ICU and started on heparin  drip.   MRI: Acute 7 mm infarct in the left subinsular white matter.  TCD with bubble study: indicative of small residual right-to-left shunt following prior VSD closure  INTERIM HISTORY/SUBJECTIVE  INR this morning 1.4. Continue Heparin , Warfarin and Aspirin .   Mother at bedside.  RN at bedside.  Stable neurological exam with no acute events overnight.  Will transfer out of ICU.   OBJECTIVE  CBC    Component Value Date/Time   WBC 3.6 (L) 05/23/2024 0540   RBC 4.24 05/23/2024 0540   HGB 11.5 (L) 05/23/2024 0540   HCT 36.3 05/23/2024 0540   PLT 187 05/23/2024 0540   MCV 85.6 05/23/2024 0540   MCH 27.1 05/23/2024 0540   MCHC 31.7 05/23/2024 0540   RDW 15.3 05/23/2024 0540   LYMPHSABS 0.6 (L) 10/18/2020 0623   MONOABS 0.5 10/18/2020 0623   EOSABS 0.1 10/18/2020 0623   BASOSABS 0.0 10/18/2020 0623    BMET    Component Value Date/Time   NA 134 (L) 05/23/2024 0540   K 4.2 05/23/2024 0540   CL 105 05/23/2024 0540   CO2 15 (L) 05/23/2024 0540   GLUCOSE 74 05/23/2024 0540   BUN 9 05/23/2024 0540   CREATININE 0.82 05/23/2024 0540   CALCIUM 9.6 05/23/2024 0540   GFRNONAA >60 05/23/2024 0540    IMAGING past 24 hours VAS US  TRANSCRANIAL DOPPLER W BUBBLES Result Date: 05/23/2024  Transcranial Doppler with Bubble Patient Name:  Peggy Bennett  Date of Exam:   05/22/2024 Medical Rec #: [id number]     Accession #:    [id number] Date of Birth: 2001-04-03     Patient Gender: F Patient Age:   23 years Exam Location:  [facility] Procedure:      VAS US  TRANSCRANIAL DOPPLER W BUBBLES Referring Phys: Trenton Passow  --------------------------------------------------------------------------------  Indications: Stroke. Comparison Study: No previous exams Performing Technologist: Jody Hill RVT, RDMS  Examination Guidelines: A complete evaluation includes B-mode imaging, spectral Doppler, color Doppler, and power Doppler as needed of all accessible portions of each vessel. Bilateral testing is considered an integral part of a complete examination. Limited examinations for reoccurring indications may be performed as noted.  Summary:  A vascular evaluation was performed. The left middle cerebral artery was studied. An IV was inserted into the patient's right forearm. Verbal informed consent was obtained.  At rest less than 10 HITS seen indicating a Spencer Grade 1 PFO. With valsalva 11-30 HITS seen indicating a Spencer Grade 2 PFO. Positive TCD Bubble study indicative of a small residual right to left shunt following prior VSD closure *See table(s) above for TCD measurements and observations.  Diagnosing physician: Eather Popp MD Electronically signed by Eather Popp MD on 05/23/2024 at 11:37:09 AM.    Final    MR BRAIN WO CONTRAST Result Date: 05/22/2024 EXAM: MRI BRAIN WITHOUT CONTRAST 05/22/2024 04:58:00 PM TECHNIQUE: Multiplanar multisequence MRI of the head/brain was performed without the administration of intravenous contrast. COMPARISON: CT head and CTA head and neck 05/21/2024. CLINICAL HISTORY: Neuro deficit, acute, stroke suspected. FINDINGS: BRAIN AND VENTRICLES: There is a 7 mm focus of acute infarct in the left subinsular white matter. Infarct in the posterior  aspect of the right basal ganglia involving the lentiform nucleus and partially involving the posterior limb of the internal capsule with surrounding gliosis. Gliosis also partially extends into the right corona radiata. No acute intracranial hemorrhage. There are multiple scattered chronic microhemorrhages in the cerebral hemispheres, particularly in the left  frontal lobe as well as in the inferior aspects of the occipital lobes. Additional small chronic microhemorrhages in the cerebellum. No mass. No midline shift. No hydrocephalus. The sella is unremarkable. Normal flow voids. ORBITS: No acute abnormality. SINUSES AND MASTOIDS: No acute abnormality. BONES AND SOFT TISSUES: Normal marrow signal. No acute soft tissue abnormality. IMPRESSION: 1. Acute 7 mm infarct in the left subinsular white matter. 2. Chronic infarct in the posterior right basal ganglia. 3. Multiple scattered chronic cerebral and cerebellar microhemorrhages. Electronically signed by: Donnice Mania MD 05/22/2024 06:42 PM EST RP Workstation: [id number]    Vitals:   05/23/24 0900 05/23/24 1000 05/23/24 1100 05/23/24 1200  BP: (!) 94/50 (!) 103/48 (!) 106/53 (!) 90/58  Pulse: 70 74 70 69  Resp: (!) 22 18 (!) 25 (!) 21  Temp:    99.3 F (37.4 C)  TempSrc:    Oral  SpO2: 98% 99% 100% 99%  Weight:      Height:        PHYSICAL EXAM General:  Alert, well-nourished, well-developed patient in no acute distress Psych:  Mood and affect appropriate for situation CV: Regular rate and rhythm on monitor Respiratory:  Regular, unlabored respirations on room air GI: Abdomen soft and nontender  NEURO:  Mental Status: AA&Ox3, patient is able to give clear and coherent history Speech/Language: speech is without  aphasia.  Naming, repetition, fluency, and comprehension intact.  Cranial Nerves:  II: PERRL. Visual fields full.  III, IV, VI: EOMI. Eyelids elevate symmetrically.  V: Sensation is intact to light touch and symmetrical to face.  VII: Face is symmetrical resting and smiling VIII: hearing intact to voice. IX, X: Palate elevates symmetrically.  No dysarthria KP:Dynloizm shrug 5/5. XII: tongue is midline Motor: 5/5 strength to all muscle groups tested.  Tone: is normal and bulk is normal Sensation- Intact to light touch bilaterally.  Coordination: FTN intact bilaterally, HKS: no  ataxia in BLE.Slight decrease in fine motor movements on left continues Gait- deferred  Most Recent NIH 0.    ASSESSMENT/PLAN  [name] is a 23 y.o. 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 due to 2-minute episode of right facial droop and word finding difficulty with slurred speech, INR 1.  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  Admitted for continued close monitoring in neuro ICU and started on heparin  drip.  NIH on Admission 0.  Transient right facial droop and slurred speech likely due to small left MCA branch cerebral infarction Etiology: Left MCA  near complete occlusion likely due to embolism from mechanical heart valve with suboptimal anticoagulation  CT head  No acute intracranial CT findings. Chronic-appearing lacunar infarcts in the genu of the left internal capsule and in the posterior lentiform nucleus, new from 2021 but not appearing recent. Mild cortical volume loss, greater than expected for age. 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  EF 79%. TCD with bubble study:  At rest less than 10 HITS seen indicating a Spencer Grade 1 PFO. With valsalva 11-30 HITS seen indicating a Spencer  Grade 2 PFO. Positive TCD Bubble study indicative of a small residual right to left shunt following prior VSD closure  LDL 89 HgbA1c 5.0 VTE prophylaxis -heparin  IV warfarin daily prior to admission, continue aspirin , heparin  IV and warfarin daily. 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   S/p AV replacement S/p Truncus Arteriosus repair Home Meds: Warfarin 7.5mg  Daily, sotalol  80mg  BID INR: 1.0--1.3--1.4 INR was noted to be 11.0 7 days ago.  Patient was told to stop her Coumadin , given vitamin K.  She had not restarted her Coumadin . Per patient, she checks her INR independently with strips, at home.  Begin anticoagulation with Heparin  IV, Warfarin  Continue daily INR checks, Heparin  dosing per Pharmacy.   Hyperlipidemia Home meds:  none LDL 89, goal < 70 No statin due to possibility of interaction with Coumadin    Hospital day # 2   Pt seen by Neuro NP/APP with MD. Note/plan to be edited by MD as needed.    Rocky JAYSON Likes, DNP [facility] Please use AMION for contact information & EPIC for messaging.  I have personally obtained history,examined this patient, reviewed notes, independently viewed imaging studies, participated in medical decision making and plan of care.ROS completed by me personally and pertinent positives fully documented  I have made any additions or clarifications directly to the above note. Agree with note above.  Patient neurological exam remains stable.  Continue IV heparin  bridge till INR is optimal.  INR target would be 2.5-3.5 given her mechanical heart valve.  Long discussion with patient and mother and answered questions.  I personally spent a total of 50 minutes in the care of the patient today including getting/reviewing separately obtained history, performing a  medically appropriate exam/evaluation, counseling and educating, placing orders, referring and communicating with other health care professionals, documenting clinical information in the EHR, independently interpreting results, and coordinating care.         Eather Popp, MD Medical Director [facility] Stroke Center Pager: [phone] 05/23/2024 4:16 PM   To contact Stroke Continuity provider, please refer to [email or web address]. After hours, contact General Neurology

## 2024-05-23 NOTE — Evaluation (Signed)
 Speech Language Pathology Evaluation Patient Details Name: Peggy Bennett MRN: [id number] DOB: 2001/03/27 Today's Date: 05/23/2024 Time: 9064-9056 SLP Time Calculation (min) (ACUTE ONLY): 8 min  Problem List:  Patient Active Problem List   Diagnosis Date Noted   Acute ischemic left MCA stroke (HCC) 05/21/2024   History of stroke 12/16/2023   Preventative health care 12/16/2023   RLQ abdominal pain 10/08/2020   Abdominal pain 10/08/2020   Iron deficiency anemia    Hemorrhagic ovarian cyst    Stroke (cerebrum) (HCC) 08/22/2019   Weakness 08/22/2019   ICD (implantable cardioverter-defibrillator) in place 03/19/2015   Wide-complex tachycardia 02/28/2015   History of prosthetic aortic valve 12/19/2014   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 female who presented after a 2 minute episode of slurred speech and facial droop. Found to be subtherapeutic INR after recent accidental overdose and M2 occlusion. PMHx: aortic valve replacement on warfarin   Assessment / Plan / Recommendation Clinical Impression  Patient presents with cognitive linguistic function that is within normal limits and returned to baseline per patient reports. SLP administered SLUMS examination which revealed intact cognitive function across all tested domains. Patient fully oriented to situation in detail, date, day of the week, and plans to return to school.  Patient is aware of prior deficits during TIA event but verbalizes that all symptoms have returned to normal. No deficits noted in the areas of receptive/expressive language nor motor speech. No further SLP services indicated at this time.    SLP Assessment  SLP Recommendation/Assessment: Patient does not need any further Speech Language Pathology Services     Assistance Recommended at Discharge  PRN  Functional Status Assessment Patient has not had a recent decline in their functional status  Frequency and Duration           SLP Evaluation Cognition  Overall Cognitive Status: Within Functional Limits for tasks assessed Orientation Level: Oriented X4       Comprehension  Auditory Comprehension Overall Auditory Comprehension: Appears within functional limits for tasks assessed    Expression Verbal Expression Overall Verbal Expression: Appears within functional limits for tasks assessed   Oral / Motor  Oral Motor/Sensory Function Overall Oral Motor/Sensory Function: Within functional limits Motor Speech Overall Motor Speech: Appears within functional limits for tasks assessed           [name], M.A., CCC-SLP  [name] 05/23/2024, 12:37 PM

## 2024-05-23 NOTE — Plan of Care (Signed)
" °  Problem: Education: Goal: Knowledge of disease or condition will improve Outcome: Progressing Goal: Knowledge of secondary prevention will improve (MUST DOCUMENT ALL) Outcome: Progressing Goal: Knowledge of patient specific risk factors will improve (DELETE if not current risk factor) Outcome: Progressing   Problem: Ischemic Stroke/TIA Tissue Perfusion: Goal: Complications of ischemic stroke/TIA will be minimized Outcome: Progressing   Problem: Coping: Goal: Will verbalize positive feelings about self Outcome: Progressing Goal: Will identify appropriate support needs Outcome: Progressing   Problem: Health Behavior/Discharge Planning: Goal: Ability to manage health-related needs will improve Outcome: Progressing Goal: Goals will be collaboratively established with patient/family Outcome: Progressing   Problem: Self-Care: Goal: Ability to participate in self-care as condition permits will improve Outcome: Progressing Goal: Verbalization of feelings and concerns over difficulty with self-care will improve Outcome: Progressing Goal: Ability to communicate needs accurately will improve Outcome: Progressing   Problem: Nutrition: Goal: Risk of aspiration will decrease Outcome: Progressing   "

## 2024-05-24 ENCOUNTER — Encounter (HOSPITAL_COMMUNITY): Payer: Self-pay | Admitting: Neurology

## 2024-05-24 DIAGNOSIS — I69344 Monoplegia of lower limb following cerebral infarction affecting left non-dominant side: Secondary | ICD-10-CM | POA: Diagnosis not present

## 2024-05-24 DIAGNOSIS — R791 Abnormal coagulation profile: Secondary | ICD-10-CM | POA: Diagnosis not present

## 2024-05-24 DIAGNOSIS — I63412 Cerebral infarction due to embolism of left middle cerebral artery: Secondary | ICD-10-CM | POA: Diagnosis not present

## 2024-05-24 DIAGNOSIS — Z952 Presence of prosthetic heart valve: Secondary | ICD-10-CM | POA: Diagnosis not present

## 2024-05-24 DIAGNOSIS — I69398 Other sequelae of cerebral infarction: Secondary | ICD-10-CM | POA: Diagnosis not present

## 2024-05-24 DIAGNOSIS — R29700 NIHSS score 0: Secondary | ICD-10-CM | POA: Diagnosis not present

## 2024-05-24 DIAGNOSIS — G2589 Other specified extrapyramidal and movement disorders: Secondary | ICD-10-CM | POA: Diagnosis not present

## 2024-05-24 LAB — HEPARIN LEVEL (UNFRACTIONATED)
Heparin Unfractionated: 0.58 [IU]/mL (ref 0.30–0.70)
Heparin Unfractionated: 0.39 [IU]/mL (ref 0.30–0.70)

## 2024-05-24 LAB — PROTIME-INR
INR: 1.6 — ABNORMAL HIGH (ref 0.8–1.2)
Prothrombin Time: 19.5 s — ABNORMAL HIGH (ref 11.4–15.2)

## 2024-05-24 NOTE — Progress Notes (Addendum)
 STROKE TEAM PROGRESS NOTE    SIGNIFICANT HOSPITAL EVENTS  12/29 early a.m: 2-minute episode of right facial droop and word finding difficulty with slurred speech, INR 1.  CTh negative  CTA showed left M2 thrombus with resultant severe stenosis.  Admitted for continued close monitoring in neuro ICU and started on heparin  drip.   MRI: Acute 7 mm infarct in the left subinsular white matter.  TCD with bubble study: indicative of small residual right-to-left shunt following prior VSD closure  INTERIM HISTORY/SUBJECTIVE  INR this morning 1.6 Continue Heparin , Warfarin and Aspirin .   Father at bedside.  Discussed plan of care thoroughly..  Stable neurological exam with no acute events overnight.    OBJECTIVE  CBC    Component Value Date/Time   WBC 3.6 (L) 05/23/2024 0540   RBC 4.24 05/23/2024 0540   HGB 11.5 (L) 05/23/2024 0540   HCT 36.3 05/23/2024 0540   PLT 187 05/23/2024 0540   MCV 85.6 05/23/2024 0540   MCH 27.1 05/23/2024 0540   MCHC 31.7 05/23/2024 0540   RDW 15.3 05/23/2024 0540   LYMPHSABS 0.6 (L) 10/18/2020 0623   MONOABS 0.5 10/18/2020 0623   EOSABS 0.1 10/18/2020 0623   BASOSABS 0.0 10/18/2020 0623    BMET    Component Value Date/Time   NA 134 (L) 05/23/2024 0540   K 4.2 05/23/2024 0540   CL 105 05/23/2024 0540   CO2 15 (L) 05/23/2024 0540   GLUCOSE 74 05/23/2024 0540   BUN 9 05/23/2024 0540   CREATININE 0.82 05/23/2024 0540   CALCIUM 9.6 05/23/2024 0540   GFRNONAA >60 05/23/2024 0540    IMAGING past 24 hours No results found.   Vitals:   05/24/24 0428 05/24/24 0500 05/24/24 0821 05/24/24 1154  BP: (!) 94/56  107/62 (!) 103/54  Pulse: 60  68 69  Resp: 17  18 14   Temp: 97.8 F (36.6 C)  97.8 F (36.6 C) 97.9 F (36.6 C)  TempSrc:   Oral Oral  SpO2: 100%  99% 100%  Weight:  47.7 kg    Height:        PHYSICAL EXAM General:  Alert, well-nourished, well-developed patient in no acute distress Psych:  Mood and affect appropriate for  situation CV: Regular rate and rhythm on monitor Respiratory:  Regular, unlabored respirations on room air GI: Abdomen soft and nontender  NEURO:  Mental Status: AA&Ox3, patient is able to give clear and coherent history Speech/Language: speech is without  aphasia.  Naming, repetition, fluency, and comprehension intact.  Cranial Nerves:  II: PERRL. Visual fields full.  III, IV, VI: EOMI. Eyelids elevate symmetrically.  V: Sensation is intact to light touch and symmetrical to face.  VII: Face is symmetrical resting and smiling VIII: hearing intact to voice. IX, X: Palate elevates symmetrically.  No dysarthria KP:Dynloizm shrug 5/5. XII: tongue is midline Motor: 5/5 strength to all muscle groups tested.  Tone: is normal and bulk is normal Sensation- Intact to light touch bilaterally.  Coordination: FTN intact bilaterally, HKS: no ataxia in BLE.Slight decrease in fine motor movements on left continues Gait- deferred  Most Recent NIH 0.    ASSESSMENT/PLAN  Peggy Bennett is a 23 y.o. 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 due to 2-minute episode of right facial droop and word  finding difficulty with slurred speech, INR 1.  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  Admitted for continued close monitoring in neuro ICU and started on heparin  drip.  NIH on Admission 0.  Transient right facial droop and slurred speech likely due to small left MCA branch cerebral infarction Etiology: Left MCA  near complete occlusion likely due to embolism from mechanical heart valve with suboptimal anticoagulation  CT head  No acute intracranial CT findings. Chronic-appearing lacunar infarcts in the genu of the left internal capsule and in the  posterior lentiform nucleus, new from 2021 but not appearing recent. Mild cortical volume loss, greater than expected for age. 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  EF 79%. TCD with bubble study:  At rest less than 10 HITS seen indicating a Spencer Grade 1 PFO. With valsalva 11-30 HITS seen indicating a Spencer Grade 2 PFO. Positive TCD Bubble study indicative of a small residual right to left shunt following prior VSD closure  LDL 89 HgbA1c 5.0 VTE prophylaxis -heparin  IV warfarin daily prior to admission, continue aspirin , heparin  IV and warfarin daily. Therapy recommendations: No follow-ups recommended. Encourage ambulation and OOB as tolerated.  Disposition:  pending INR increase to goal, will discharge to home.   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   S/p AV replacement S/p Truncus Arteriosus repair Hx of v tach Home Meds: Warfarin 7.5mg  Daily, sotalol  80mg  BID continued INR: 1.0--1.3--1.4-1.6 Goal INR 2.5-3.5 INR was noted to be 11.0 7 days ago.  Patient was told to stop her Coumadin , given vitamin K.  She had not restarted her Coumadin . Per patient, she checks her INR independently with strips, at home.  Begin anticoagulation with Heparin  IV, Warfarin  Continue daily INR checks, Heparin  dosing per Pharmacy.   Hyperlipidemia Home meds:   none LDL 89, goal < 70 No statin due to possibility of interaction with Coumadin   Depression continue home bupropion  and sertraline .   Hospital day # 3   Pt seen by Neuro NP/APP with MD. Note/plan to be edited by MD as needed.    Rocky JAYSON Likes, DNP [facility] Please use AMION for contact information & EPIC for messaging.  I have personally obtained history,examined this patient, reviewed notes, independently viewed imaging studies, participated in medical decision making and plan of care.ROS completed by me personally and pertinent positives fully documented  I have made any additions or clarifications directly to the above note. Agree with note above.  Continue IV heparin  bridge with warfarin until INR is close to 2.  Eather Popp, MD Medical Director [facility] Stroke Center Pager: [phone] 05/24/2024 3:04 PM    To contact Stroke Continuity provider, please refer to [email or web address]. After hours, contact General Neurology

## 2024-05-24 NOTE — TOC CM/SW Note (Signed)
 Transition of Care [facility]) - Inpatient Brief Assessment   Patient Details  Name: Peggy Bennett MRN: [id number] Date of Birth: 11/30/2000  [facility]) CM/SW Contact:    Roxie KANDICE Stain, RN Phone Number: 05/24/2024, 2:13 PM   Clinical Narrative: Spoke to patient regarding transition needs.  Patient is home from college at [employer] state stating with family. No ICM needs at this time.   Transition of Care Asessment: Insurance and Status: Insurance coverage has been reviewed Patient has primary care physician: Yes Home environment has been reviewed: safe to discharge home Prior level of function:: independent Prior/Current Home Services: No current home services Social Drivers of Health Review: SDOH reviewed no interventions necessary Readmission risk has been reviewed: Yes Transition of care needs: no transition of care needs at this time

## 2024-05-24 NOTE — Progress Notes (Signed)
 PHARMACY - ANTICOAGULATION CONSULT NOTE  Pharmacy Consult for heparin  + warfarin Indication: mAVR, CVA  Allergies[1]  Patient Measurements: Height: 5' 4 (162.6 cm) Weight: 47.7 kg (105 lb 2.6 oz) IBW/kg (Calculated) : 54.7 HEPARIN  DW (KG): 45.4  Vital Signs: Temp: 98.3 F (36.8 C) (12/31 1610) Temp Source: Oral (12/31 1610) BP: 112/65 (12/31 1610) Pulse Rate: 75 (12/31 1610)  Labs: Recent Labs    05/22/24 0742 05/23/24 0540 05/24/24 0153 05/24/24 1339  HGB  --  11.5*  --   --   HCT  --  36.3  --   --   PLT  --  187  --   --   LABPROT 16.6* 17.4* 19.5*  --   INR 1.3* 1.4* 1.6*  --   HEPARINUNFRC 0.33 0.47 0.58 0.39  CREATININE  --  0.82  --   --     Estimated Creatinine Clearance: 81.7 mL/min (by C-G formula based on SCr of 0.82 mg/dL).   Medical History: Past Medical History:  Diagnosis Date   Stroke [facility])    Phreesia 01/16/2020   Truncus arteriosus      Assessment: 55 YOF presenting with TIA, hx of aortic valve replacement on warfarin PTA, INR subtherapeutic at 1.0 on admission and to start heparin  gtt, avoid bolus for now with current stroke workup  PTA warfarin regimen - 7.5 mg daily. Of note, pt has home INR machine  05/24/24 PM: Heparin  level 0.39, therapeutic at heparin  600 units/hr. No issues with infusion running or signs of bleeding per RN.  Goal of Therapy:  Heparin  level 0.3-0.5 units/ml INR 2.5-3.5 Monitor platelets by anticoagulation protocol: Yes   Plan:  Continue heparin  gtt to 600 units/hr Warfarin 7.5 mg PO x1 today Heparin  level and INR with AM labs F/u INR daily, CBC ; f/u s/sx bleeding   Morna Breach, PharmD, BCPS PGY2 Cardiology Pharmacy Resident 05/24/2024 5:33 PM    [1] No Known Allergies

## 2024-05-24 NOTE — Progress Notes (Signed)
 PHARMACY - ANTICOAGULATION CONSULT NOTE  Pharmacy Consult for heparin  + warfarin Indication: mAVR, CVA  Allergies[1]  Patient Measurements: Height: 5' 4 (162.6 cm) Weight: 47.7 kg (105 lb 2.6 oz) IBW/kg (Calculated) : 54.7 HEPARIN  DW (KG): 45.4  Vital Signs: Temp: 97.8 F (36.6 C) (12/31 0821) Temp Source: Oral (12/31 0821) BP: 107/62 (12/31 0821) Pulse Rate: 68 (12/31 0821)  Labs: Recent Labs    05/21/24 1641 05/22/24 0742 05/23/24 0540 05/24/24 0153  HGB 11.9*  --  11.5*  --   HCT 38.3  --  36.3  --   PLT 249  --  187  --   LABPROT 14.0 16.6* 17.4* 19.5*  INR 1.0 1.3* 1.4* 1.6*  HEPARINUNFRC  --  0.33 0.47 0.58  CREATININE 0.86  --  0.82  --     Estimated Creatinine Clearance: 81.7 mL/min (by C-G formula based on SCr of 0.82 mg/dL).   Medical History: Past Medical History:  Diagnosis Date   Stroke [facility])    [employer]reesia 01/16/2020   Truncus arteriosus      Assessment: 47 YOF presenting with TIA, hx of aortic valve replacement on warfarin PTA, INR subtherapeutic at 1.0 on admission and to start heparin  gtt, avoid bolus for now with current stroke workup  PTA warfarin regimen - 7.5 mg daily. Of note, pt has home INR machine  CBC this AM is stable  INR 1.6, heparin  slightly above modified goal this AM.   Goal of Therapy:  Heparin  level 0.3-0.5 units/ml INR 2.5-3.5 Monitor platelets by anticoagulation protocol: Yes   Plan:  Decrease heparin  gtt to 600 units/hr 6 hr HL Coumadin  7.5mg  PO x1 F/u INR daily, CBC ; f/u s/sx bleeding   Sergio Batch, PharmD, BCIDP, AAHIVP, CPP Infectious Disease Pharmacist 05/24/2024 11:12 AM      [1] No Known Allergies

## 2024-05-24 NOTE — Plan of Care (Signed)
" °  Problem: Education: Goal: Knowledge of disease or condition will improve Outcome: Progressing   Problem: Ischemic Stroke/TIA Tissue Perfusion: Goal: Complications of ischemic stroke/TIA will be minimized Outcome: Progressing   Problem: Coping: Goal: Will verbalize positive feelings about self Outcome: Progressing Goal: Will identify appropriate support needs Outcome: Progressing   Problem: Health Behavior/Discharge Planning: Goal: Goals will be collaboratively established with patient/family Outcome: Progressing   Problem: Nutrition: Goal: Risk of aspiration will decrease Outcome: Progressing Goal: Dietary intake will improve Outcome: Progressing   Problem: Clinical Measurements: Goal: Respiratory complications will improve Outcome: Progressing Goal: Cardiovascular complication will be avoided Outcome: Progressing   Problem: Elimination: Goal: Will not experience complications related to bowel motility Outcome: Progressing Goal: Will not experience complications related to urinary retention Outcome: Progressing   Problem: Skin Integrity: Goal: Risk for impaired skin integrity will decrease Outcome: Progressing   "

## 2024-05-24 NOTE — Plan of Care (Signed)

## 2024-05-25 DIAGNOSIS — R791 Abnormal coagulation profile: Secondary | ICD-10-CM | POA: Diagnosis not present

## 2024-05-25 DIAGNOSIS — I63412 Cerebral infarction due to embolism of left middle cerebral artery: Secondary | ICD-10-CM | POA: Diagnosis not present

## 2024-05-25 DIAGNOSIS — R29700 NIHSS score 0: Secondary | ICD-10-CM | POA: Diagnosis not present

## 2024-05-25 DIAGNOSIS — I69344 Monoplegia of lower limb following cerebral infarction affecting left non-dominant side: Secondary | ICD-10-CM | POA: Diagnosis not present

## 2024-05-25 LAB — BASIC METABOLIC PANEL WITH GFR
Anion gap: 13 (ref 5–15)
BUN: 18 mg/dL (ref 6–20)
CO2: 20 mmol/L — ABNORMAL LOW (ref 22–32)
Calcium: 9.5 mg/dL (ref 8.9–10.3)
Chloride: 102 mmol/L (ref 98–111)
Creatinine, Ser: 0.93 mg/dL (ref 0.44–1.00)
GFR, Estimated: >60 mL/min
Glucose, Bld: 95 mg/dL (ref 70–99)
Potassium: 4.1 mmol/L (ref 3.5–5.1)
Sodium: 134 mmol/L — ABNORMAL LOW (ref 135–145)

## 2024-05-25 LAB — CBC
HCT: 37.9 % (ref 36.0–46.0)
Hemoglobin: 12.4 g/dL (ref 12.0–15.0)
MCH: 26.8 pg (ref 26.0–34.0)
MCHC: 32.7 g/dL (ref 30.0–36.0)
MCV: 82 fL (ref 80.0–100.0)
Platelets: 262 K/uL (ref 150–400)
RBC: 4.62 MIL/uL (ref 3.87–5.11)
RDW: 15 % (ref 11.5–15.5)
WBC: 4.5 K/uL (ref 4.0–10.5)
nRBC: 0 % (ref 0.0–0.2)

## 2024-05-25 LAB — PROTIME-INR
INR: 1.7 — ABNORMAL HIGH (ref 0.8–1.2)
Prothrombin Time: 21.3 s — ABNORMAL HIGH (ref 11.4–15.2)

## 2024-05-25 LAB — HEPARIN LEVEL (UNFRACTIONATED): Heparin Unfractionated: 0.39 [IU]/mL (ref 0.30–0.70)

## 2024-05-25 MED ORDER — ORAL CARE MOUTH RINSE: 15.0000 mL | OROMUCOSAL | Status: DC | PRN

## 2024-05-25 MED ORDER — WARFARIN SODIUM 5 MG PO TABS
10.0000 mg | ORAL_TABLET | Freq: Once | ORAL | Status: AC
  Administered 2024-05-25: 10 mg via ORAL
  Filled 2024-05-25: qty 2

## 2024-05-25 NOTE — Progress Notes (Signed)
 PHARMACY - ANTICOAGULATION CONSULT NOTE  Pharmacy Consult for heparin  + warfarin Indication: mAVR, CVA  Allergies[1]  Patient Measurements: Height: 5' 4 (162.6 cm) Weight: 47.7 kg (105 lb 2.6 oz) IBW/kg (Calculated) : 54.7 HEPARIN  DW (KG): 45.4  Vital Signs: Temp: 98.3 F (36.8 C) (01/01 0857) Temp Source: Oral (01/01 0857) BP: 111/59 (01/01 0857) Pulse Rate: 76 (01/01 0857)  Labs: Recent Labs    05/23/24 0540 05/24/24 0153 05/24/24 1339 05/25/24 0135  HGB 11.5*  --   --  12.4  HCT 36.3  --   --  37.9  PLT 187  --   --  262  LABPROT 17.4* 19.5*  --  21.3*  INR 1.4* 1.6*  --  1.7*  HEPARINUNFRC 0.47 0.58 0.39 0.39  CREATININE 0.82  --   --  0.93    Estimated Creatinine Clearance: 72.1 mL/min (by C-G formula based on SCr of 0.93 mg/dL).   Medical History: Past Medical History:  Diagnosis Date   Stroke [facility])    [employer]ia 01/16/2020   Truncus arteriosus      Assessment: 43 YOF presenting with TIA, hx of aortic valve replacement on warfarin PTA, INR subtherapeutic at 1.0 on admission and to start heparin  gtt, avoid bolus for now with current stroke workup. MRI showed an acute 7mm infarct.   PTA warfarin regimen - 7.5 mg daily. Of note, pt has home INR machine  05/26/23: Heparin  level 0.39, therapeutic at heparin  600 units/hr. INR remains subtherapeutic today at 1.7. INR has increased slowly, but goal is at least 2.5 so will increase warfarin dose today. Confirmed with neurology to ensure still targeting a normal INR goal in setting of stroke. Hgb, PLT WNL. No bleeding or pauses in infusion per RN.  Goal of Therapy:  Heparin  level 0.3-0.5 units/ml INR 2.5-3.5 Monitor platelets by anticoagulation protocol: Yes   Plan:  Continue heparin  gtt to 600 units/hr Warfarin 10 mg PO x1 today Heparin  level and INR with AM labs F/u INR daily, CBC ; f/u s/sx bleeding   Izetta Carl, PharmD PGY1 Pharmacy Resident      [1] No Known Allergies

## 2024-05-25 NOTE — Plan of Care (Addendum)
 She is calm, family on bedside, walk independently.  Problem: Education: Goal: Knowledge of disease or condition will improve Outcome: Progressing   Problem: Coping: Goal: Will verbalize positive feelings about self Outcome: Progressing Goal: Will identify appropriate support needs Outcome: Progressing   Problem: Self-Care: Goal: Ability to communicate needs accurately will improve Outcome: Progressing   Problem: Nutrition: Goal: Dietary intake will improve Outcome: Progressing   Problem: Clinical Measurements: Goal: Respiratory complications will improve Outcome: Progressing Goal: Cardiovascular complication will be avoided Outcome: Progressing   Problem: Activity: Goal: Risk for activity intolerance will decrease Outcome: Progressing

## 2024-05-25 NOTE — Plan of Care (Signed)

## 2024-05-25 NOTE — Progress Notes (Addendum)
 STROKE TEAM PROGRESS NOTE    SIGNIFICANT HOSPITAL EVENTS  12/29 early a.m: 2-minute episode of right facial droop and word finding difficulty with slurred speech, INR 1.  CTh negative  CTA showed left M2 thrombus with resultant severe stenosis.  Admitted for continued close monitoring in neuro ICU and started on heparin  drip.   MRI: Acute 7 mm infarct in the left subinsular white matter.  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  Patient ran out of strips to test her INR so we will continue patient in the hospital for now until her INR gets to at least 2, family updated  Family at bedside.  Patient laying in the bed in no apparent distress No new neurological events overnight.  Neurological exam remains stable and unchanged  CBC    Component Value Date/Time   WBC 4.5 05/25/2024 0135   RBC 4.62 05/25/2024 0135   HGB 12.4 05/25/2024 0135   HCT 37.9 05/25/2024 0135   PLT 262 05/25/2024 0135   MCV 82.0 05/25/2024 0135   MCH 26.8 05/25/2024 0135   MCHC 32.7 05/25/2024 0135   RDW 15.0 05/25/2024 0135   LYMPHSABS 0.6 (L) 10/18/2020 0623   MONOABS 0.5 10/18/2020 0623   EOSABS 0.1 10/18/2020 0623   BASOSABS 0.0 10/18/2020 0623    BMET    Component Value Date/Time   NA 134 (L) 05/25/2024 0135   K 4.1 05/25/2024 0135   CL 102 05/25/2024 0135   CO2 20 (L) 05/25/2024 0135   GLUCOSE 95 05/25/2024 0135   BUN 18 05/25/2024 0135   CREATININE 0.93 05/25/2024 0135   CALCIUM 9.5 05/25/2024 0135   GFRNONAA >60 05/25/2024 0135    IMAGING past 24 hours No results found.   Vitals:   05/24/24 2349 05/25/24 0500 05/25/24 0827 05/25/24 0857  BP: 103/64 (!) 109/55 (!) 108/52 (!) 111/59  Pulse: 72 70 67 76  Resp: 16  16 16   Temp: 98.2 F (36.8 C) 97.7 F (36.5 C) 97.9 F (36.6 C) 98.3 F (36.8 C)  TempSrc: Oral Axillary Oral Oral   SpO2: 100% 100% 100%   Weight:      Height:        PHYSICAL EXAM General:  Alert, well-nourished, well-developed patient in no acute distress Psych:  Mood and affect appropriate for situation CV: Regular rate and rhythm on monitor Respiratory:  Regular, unlabored respirations on room air GI: Abdomen soft and nontender  NEURO:  Mental Status: AA&Ox3, patient is able to give clear and coherent history Speech/Language: speech is without  aphasia.  Naming, repetition, fluency, and comprehension intact.  Cranial Nerves:  II: PERRL. Visual fields full.  III, IV, VI: EOMI. Eyelids elevate symmetrically.  V: Sensation is intact to light touch and symmetrical to face.  VII: Face is symmetrical resting and smiling VIII: hearing intact to voice. IX, X: Palate elevates symmetrically.  No dysarthria KP:Dynloizm shrug 5/5. XII: tongue is midline Motor: 5/5 strength to all muscle groups tested.  Tone: is normal and bulk is normal Sensation- Intact to light touch bilaterally.  Coordination: FTN intact bilaterally, HKS: no ataxia in BLE.Slight decrease in fine motor movements on left continues Gait- deferred  Most Recent NIH 0.    ASSESSMENT/PLAN  [name] is a 24 y.o. female with history of truncus arteriosus status postrepair and mechanical aortic valve, on warfarin with goal INR 2.5-3.5, status  post ICD 2017, prior strokes with residual mild left leg weakness and slowed left hand fine motor movements who presented 12/29 early a.m. due to 2-minute episode of right facial droop and word finding difficulty with slurred speech, INR 1.  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  Admitted for continued close monitoring in neuro ICU and started on heparin  drip.  NIH on Admission 0.  Transient right facial droop and slurred speech likely due to small left MCA branch cerebral infarction Etiology: Left MCA  near complete occlusion likely due to embolism from  mechanical heart valve with suboptimal anticoagulation  CT head  No acute intracranial CT findings. Chronic-appearing lacunar infarcts in the genu of the left internal capsule and in the posterior lentiform nucleus, new from 2021 but not appearing recent. Mild cortical volume loss, greater than expected for age. 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  EF 79%. TCD with bubble study:  At rest less than 10 HITS seen indicating a Spencer Grade 1 PFO. With valsalva 11-30 HITS seen indicating a Spencer Grade 2 PFO. Positive TCD Bubble study indicative of a small residual right to left shunt following prior VSD closure  LDL 89 HgbA1c 5.0 VTE prophylaxis -heparin  IV warfarin daily prior to admission, continue aspirin , heparin  IV and warfarin daily. Therapy recommendations: No follow-ups recommended. Encourage ambulation and OOB as tolerated.  Disposition:  pending INR increase to goal, will discharge to home.   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   S/p AV replacement S/p Truncus Arteriosus repair Hx of v tach Home Meds: Warfarin 7.5mg  Daily, sotalol  80mg  BID continued INR: 1.0--1.3--1.4-1.6-1.7 Goal INR 2.5-3.5 INR was noted to be 11.0 7 days ago.  Patient was told to stop her Coumadin , given vitamin K.  She had not restarted her Coumadin . Per patient, she  checks her INR independently with strips, at home.  Begin anticoagulation with Heparin  IV, Warfarin  Continue daily INR checks, Heparin  dosing per Pharmacy.   Hyperlipidemia Home meds:  none LDL 89, goal < 70 No statin due to possibility of interaction with Coumadin   Depression continue home bupropion  and sertraline .   Hospital day # 4   Pt seen by Neuro NP/APP with MD. Note/plan to be edited by MD as needed.     Karna Geralds DNP, ACNPC-AG  [facility]  I have personally obtained history,examined this patient, reviewed notes, independently viewed imaging studies, participated in medical decision making and plan of care.ROS completed by me personally and pertinent positives fully documented  I have made any additions or clarifications directly to the above note. Agree with note above.  Continue IV heparin  drip and warfarin until INR at least about 2.  Discussed with patient and mother and questions.  Eather Popp, MD Medical Director [facility] Pager: [phone] 05/25/2024 11:16 AM    To contact Stroke Continuity provider, please refer to [email or web address]. After hours, contact  General Neurology

## 2024-05-26 DIAGNOSIS — I63412 Cerebral infarction due to embolism of left middle cerebral artery: Secondary | ICD-10-CM | POA: Diagnosis not present

## 2024-05-26 DIAGNOSIS — R29700 NIHSS score 0: Secondary | ICD-10-CM | POA: Diagnosis not present

## 2024-05-26 DIAGNOSIS — Z7901 Long term (current) use of anticoagulants: Secondary | ICD-10-CM | POA: Diagnosis not present

## 2024-05-26 DIAGNOSIS — Z952 Presence of prosthetic heart valve: Secondary | ICD-10-CM | POA: Diagnosis not present

## 2024-05-26 DIAGNOSIS — R791 Abnormal coagulation profile: Secondary | ICD-10-CM | POA: Diagnosis not present

## 2024-05-26 LAB — CBC
HCT: 34 % — ABNORMAL LOW (ref 36.0–46.0)
Hemoglobin: 10.9 g/dL — ABNORMAL LOW (ref 12.0–15.0)
MCH: 26.8 pg (ref 26.0–34.0)
MCHC: 32.1 g/dL (ref 30.0–36.0)
MCV: 83.7 fL (ref 80.0–100.0)
Platelets: 209 K/uL (ref 150–400)
RBC: 4.06 MIL/uL (ref 3.87–5.11)
RDW: 14.9 % (ref 11.5–15.5)
WBC: 4.1 K/uL (ref 4.0–10.5)
nRBC: 0 % (ref 0.0–0.2)

## 2024-05-26 LAB — PROTIME-INR
INR: 2.1 — ABNORMAL HIGH (ref 0.8–1.2)
Prothrombin Time: 24.5 s — ABNORMAL HIGH (ref 11.4–15.2)

## 2024-05-26 LAB — HEPARIN LEVEL (UNFRACTIONATED): Heparin Unfractionated: 0.4 [IU]/mL (ref 0.30–0.70)

## 2024-05-26 MED ORDER — WARFARIN SODIUM 7.5 MG PO TABS
7.5000 mg | ORAL_TABLET | Freq: Once | ORAL | Status: AC
  Administered 2024-05-26: 7.5 mg via ORAL
  Filled 2024-05-26: qty 1

## 2024-05-26 NOTE — Progress Notes (Signed)
 PHARMACY - ANTICOAGULATION CONSULT NOTE  Pharmacy Consult for heparin  + warfarin Indication: mAVR, CVA  Allergies[1]  Patient Measurements: Height: 5' 4 (162.6 cm) Weight: 47.7 kg (105 lb 2.6 oz) IBW/kg (Calculated) : 54.7 HEPARIN  DW (KG): 45.4  Vital Signs: Temp: 98.6 F (37 C) (01/02 0723) Temp Source: Oral (01/02 0423) BP: 105/67 (01/02 0909) Pulse Rate: 74 (01/02 0909)  Labs: Recent Labs    05/24/24 0153 05/24/24 1339 05/25/24 0135 05/26/24 0235  HGB  --   --  12.4 10.9*  HCT  --   --  37.9 34.0*  PLT  --   --  262 209  LABPROT 19.5*  --  21.3* 24.5*  INR 1.6*  --  1.7* 2.1*  HEPARINUNFRC 0.58 0.39 0.39 0.40  CREATININE  --   --  0.93  --     Estimated Creatinine Clearance: 72.1 mL/min (by C-G formula based on SCr of 0.93 mg/dL).  Assessment: 89 YOF presenting with TIA, hx of aortic valve replacement on warfarin PTA, INR subtherapeutic at 1.0 on admission and to start heparin  gtt, avoid bolus for now with current stroke workup. MRI showed an acute 7mm infarct.   PTA warfarin regimen - 7.5 mg daily. Of note, pt has home INR machine  05/27/23: heparin  level 0.4, therapeutic at 600 units/hr. INR remains subtherapeutic today at 2.1 but is trending up to goal. Confirmed with neurology to ensure still targeting a normal INR goal in setting of stroke. No bleeding noted, Hgb down 10.9, platelets are normal.  Goal of Therapy:  Heparin  level 0.3-0.5 units/ml INR 2.5-3.5 Monitor platelets by anticoagulation protocol: Yes   Plan:  Continue heparin  gtt at 600 units/hr - consider d/c when INR >2.5 Warfarin 7.5 mg PO x1 today Heparin  level, CBC and INR daily Monitor for s/sx of bleeding  Thank you for involving pharmacy in this patient's care.  Delon Sax, PharmD, BCPS Clinical Pharmacist Clinical phone for 05/26/2024 is [phone] 05/26/2024 9:38 AM         [1] No Known Allergies

## 2024-05-26 NOTE — TOC Progression Note (Signed)
 Transition of Care [facility]) - Progression Note    Patient Details  Name: Peggy Bennett MRN: [id number] Date of Birth: September 02, 2000  [facility]) CM/SW Contact  Andrez JULIANNA George, RN Phone Number: 05/26/2024, 1:19 PM  Clinical Narrative:     Pt checks her own INR at home but is currently out of the test strips. She called the company: [employer] and they said would be 2 weeks to receive. CM called and spoke to [facility] and they are waiting on insurance to approve the strips and then they will be overnight shipped. The representative also is asking if 6 strips can be given to the patient in the interim and sent overnight. Pt and sister at the bedside updated along with the NP.  No needs per therapies.   Plan is for her to dc home tomorrow. IP Care management following.  Expected Discharge Plan: Home/Self Care                 Expected Discharge Plan and Services                                               Social Drivers of Health (SDOH) Interventions SDOH Screenings   Food Insecurity: No Food Insecurity (05/24/2024)  Housing: Low Risk (05/24/2024)  Transportation Needs: No Transportation Needs (05/24/2024)  Utilities: Not At Risk (05/24/2024)  Depression (PHQ2-9): Medium Risk (12/16/2023)  Financial Resource Strain: Low Risk (10/20/2022)   Received from [facility]  Social Connections: Unknown (10/07/2021)   Received from [facility]  Tobacco Use: Low Risk (05/24/2024)    Readmission Risk Interventions     No data to display

## 2024-05-26 NOTE — Plan of Care (Signed)
" °  Problem: Ischemic Stroke/TIA Tissue Perfusion: Goal: Complications of ischemic stroke/TIA will be minimized Outcome: Progressing   Problem: Coping: Goal: Will verbalize positive feelings about self Outcome: Progressing   Problem: Health Behavior/Discharge Planning: Goal: Ability to manage health-related needs will improve Outcome: Progressing   Problem: Self-Care: Goal: Ability to communicate needs accurately will improve Outcome: Progressing   Problem: Nutrition: Goal: Adequate nutrition will be maintained Outcome: Progressing   Problem: Elimination: Goal: Will not experience complications related to bowel motility Outcome: Progressing   "

## 2024-05-26 NOTE — Discharge Instructions (Signed)

## 2024-05-26 NOTE — Plan of Care (Signed)
" °  Problem: Education: Goal: Knowledge of disease or condition will improve Outcome: Progressing   Problem: Ischemic Stroke/TIA Tissue Perfusion: Goal: Complications of ischemic stroke/TIA will be minimized Outcome: Progressing   Problem: Health Behavior/Discharge Planning: Goal: Goals will be collaboratively established with patient/family Outcome: Progressing   Problem: Education: Goal: Knowledge of General Education information will improve Description: Including pain rating scale, medication(s)/side effects and non-pharmacologic comfort measures Outcome: Progressing   Problem: Clinical Measurements: Goal: Cardiovascular complication will be avoided Outcome: Progressing   Problem: Pain Managment: Goal: General experience of comfort will improve and/or be controlled Outcome: Progressing   "

## 2024-05-26 NOTE — Progress Notes (Addendum)
 STROKE TEAM PROGRESS NOTE    SIGNIFICANT HOSPITAL EVENTS  12/29 early a.m: 2-minute episode of right facial droop and word finding difficulty with slurred speech, INR 1.  CTh negative  CTA showed left M2 thrombus with resultant severe stenosis.  Admitted for continued close monitoring in neuro ICU and started on heparin  drip.   MRI: Acute 7 mm infarct in the left subinsular white matter.  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  Discussed possibility of discharge with the patient and family at bedside.  Family and patient are concerned as she had run out of home strips to check her INR.  Worked with case management and we are able to get home strips sent to her, which will arrive Sunday and patient and family are good with planning discharge for tomorrow. We have outpatient lab check scheduled for Monday in case strips do not arrive.   Continue Heparin , Warfarin and Aspirin .  Patient ran out of strips to test her INR so we will continue patient in the hospital for now until her INR gets to at least 2, family updated  No new neurological events overnight.  Neurological exam remains stable and unchanged  CBC    Component Value Date/Time   WBC 4.1 05/26/2024 0235   RBC 4.06 05/26/2024 0235   HGB 10.9 (L) 05/26/2024 0235   HCT 34.0 (L) 05/26/2024 0235   PLT 209 05/26/2024 0235   MCV 83.7 05/26/2024 0235   MCH 26.8 05/26/2024 0235   MCHC 32.1 05/26/2024 0235   RDW 14.9 05/26/2024 0235   LYMPHSABS 0.6 (L) 10/18/2020 0623   MONOABS 0.5 10/18/2020 0623   EOSABS 0.1 10/18/2020 0623   BASOSABS 0.0 10/18/2020 0623    BMET    Component Value Date/Time   NA 134 (L) 05/25/2024 0135   K 4.1 05/25/2024 0135   CL 102 05/25/2024 0135   CO2 20 (L) 05/25/2024 0135   GLUCOSE 95 05/25/2024 0135   BUN 18 05/25/2024 0135   CREATININE 0.93 05/25/2024 0135   CALCIUM 9.5 05/25/2024  0135   GFRNONAA >60 05/25/2024 0135    IMAGING past 24 hours No results found.   Vitals:   05/26/24 0423 05/26/24 0723 05/26/24 0909 05/26/24 1109  BP: 109/62 107/68 105/67 114/70  Pulse: 68 65 74 77  Resp: 18 16  16   Temp: 98.5 F (36.9 C) 98.6 F (37 C)  98.3 F (36.8 C)  TempSrc: Oral     SpO2: 100% 100%  100%  Weight:      Height:        PHYSICAL EXAM General:  Alert, well-nourished, well-developed patient in no acute distress Psych:  Mood and affect appropriate for situation CV: Regular rate and rhythm on monitor Respiratory:  Regular, unlabored respirations on room air GI: Abdomen soft and nontender  NEURO:  Mental Status: AA&Ox3, patient is able to give clear and coherent history Speech/Language: speech is without  aphasia.  Naming, repetition, fluency, and comprehension intact.  Cranial Nerves:  II: PERRL. Visual fields full.  III, IV, VI: EOMI. Eyelids elevate symmetrically.  V: Sensation is intact to light touch and symmetrical to face.  VII: Face is symmetrical resting and smiling VIII: hearing intact to voice. IX, X: Palate elevates symmetrically.  No dysarthria KP:Dynloizm shrug 5/5. XII: tongue is midline Motor: 5/5 strength to all muscle groups tested.  Tone: is normal and bulk is normal Sensation- Intact to  light touch bilaterally.  Coordination: FTN intact bilaterally, HKS: no ataxia in BLE.Slight decrease in fine motor movements on left continues Gait- deferred  Most Recent NIH 0.   ASSESSMENT/PLAN  [name] is a 24 y.o. 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 due to 2-minute episode of right facial droop and word finding difficulty with slurred speech, INR 1.  CTh negative CTA showed left M2 thrombus with  resultant severe stenosis.  Admitted for continued close monitoring in neuro ICU and started on heparin  drip.  NIH on Admission 0.  Transient right facial droop and slurred speech likely due to small left MCA branch cerebral infarction Etiology: Left MCA  near complete occlusion likely due to embolism from mechanical heart valve with suboptimal anticoagulation  CT head  No acute intracranial CT findings. Chronic-appearing lacunar infarcts in the genu of the left internal capsule and in the posterior lentiform nucleus, new from 2021 but not appearing recent. Mild cortical volume loss, greater than expected for age. 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  EF 79%. TCD with bubble study:  At rest less than 10 HITS seen indicating a Peggy Bennett Grade 1 PFO. With valsalva 11-30 HITS seen indicating a Peggy Bennett Grade 2 PFO. Positive TCD Bubble study indicative of a small residual right to left shunt following prior VSD closure  LDL 89 HgbA1c 5.0 VTE prophylaxis -heparin  IV warfarin daily prior to admission, continue aspirin , heparin  IV and warfarin daily. Therapy recommendations: No follow-ups recommended. Encourage ambulation and OOB as tolerated.  Disposition:  pending INR increase to goal, will discharge to home.   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   S/p AV  replacement S/p Truncus Arteriosus repair Hx of v tach Home Meds: Warfarin 7.5mg  Daily, sotalol  80mg  BID continued INR: 1.0--1.3--1.4-1.6-1.7-2.1 Goal INR 2.5-3.5 INR was noted to be 11.0 7 days ago.  Patient was told to stop her Coumadin , given vitamin K.  She had not restarted her Coumadin . Per patient, she checks her INR independently with strips, at home. Strips will be sent to her home on discharge.  Outpatient lab check scheduled for Monday in case strips do not arrive in time. Continue anticoagulation with Heparin  IV, Warfarin  Continue daily INR checks, Heparin  dosing per Pharmacy.   Hyperlipidemia Home meds:  none LDL 89, goal < 70 No statin due to possibility of interaction with Coumadin   Depression continue home bupropion  and sertraline .   Hospital day # 5  Pt seen by Neuro NP/APP with MD. Note/plan to be edited by MD as needed.     Rocky JAYSON Likes, DNP [facility] Please use AMION for contact information & EPIC for messaging.  I have personally obtained history,examined this patient, reviewed notes,  independently viewed imaging studies, participated in medical decision making and plan of care.ROS completed by me personally and pertinent positives fully documented  I have made any additions or clarifications directly to the above note. Agree with note above.  Continue IV heparin  and warfarin till target INR is 2.5 before discharge.  We will need to coordinate taking his INR at that after discharge and follow-up with her cardiologist.  Eather Popp, MD Medical Director [facility] Pager: [phone] 05/26/2024 12:49 PM    To contact Stroke Continuity provider, please refer to [email or web address]. After hours, contact General Neurology

## 2024-05-27 ENCOUNTER — Other Ambulatory Visit (HOSPITAL_COMMUNITY): Payer: Self-pay

## 2024-05-27 DIAGNOSIS — R29700 NIHSS score 0: Secondary | ICD-10-CM | POA: Diagnosis not present

## 2024-05-27 DIAGNOSIS — I63412 Cerebral infarction due to embolism of left middle cerebral artery: Secondary | ICD-10-CM | POA: Diagnosis not present

## 2024-05-27 LAB — PROTIME-INR
INR: 2.4 — ABNORMAL HIGH (ref 0.8–1.2)
Prothrombin Time: 27.2 s — ABNORMAL HIGH (ref 11.4–15.2)

## 2024-05-27 LAB — CBC
HCT: 34.2 % — ABNORMAL LOW (ref 36.0–46.0)
Hemoglobin: 11.2 g/dL — ABNORMAL LOW (ref 12.0–15.0)
MCH: 27.3 pg (ref 26.0–34.0)
MCHC: 32.7 g/dL (ref 30.0–36.0)
MCV: 83.4 fL (ref 80.0–100.0)
Platelets: 204 K/uL (ref 150–400)
RBC: 4.1 MIL/uL (ref 3.87–5.11)
RDW: 14.9 % (ref 11.5–15.5)
WBC: 3.5 K/uL — ABNORMAL LOW (ref 4.0–10.5)
nRBC: 0 % (ref 0.0–0.2)

## 2024-05-27 LAB — HEPARIN LEVEL (UNFRACTIONATED): Heparin Unfractionated: 0.41 [IU]/mL (ref 0.30–0.70)

## 2024-05-27 MED ORDER — WARFARIN SODIUM 7.5 MG PO TABS: 7.5000 mg | ORAL_TABLET | Freq: Once | ORAL | Status: DC

## 2024-05-27 MED ORDER — SOTALOL HCL 80 MG PO TABS
40.0000 mg | ORAL_TABLET | Freq: Two times a day (BID) | ORAL | 0 refills | Status: AC
  Filled 2024-05-27: qty 60, 60d supply, fill #0

## 2024-05-27 MED ORDER — ASPIRIN 81 MG PO CHEW
81.0000 mg | CHEWABLE_TABLET | Freq: Every day | ORAL | 0 refills | Status: AC
  Filled 2024-05-27: qty 30, 30d supply, fill #0

## 2024-05-27 MED ORDER — WARFARIN SODIUM 7.5 MG PO TABS
7.5000 mg | ORAL_TABLET | Freq: Every day | ORAL | 0 refills | Status: AC
  Filled 2024-05-27: qty 30, 30d supply, fill #0

## 2024-05-27 NOTE — Progress Notes (Addendum)
 Addendum: patient is being discharged today on home warfarin and aspirin  per Neurology team. Will d/c heparin  at this time.   PHARMACY - ANTICOAGULATION CONSULT NOTE  Pharmacy Consult for heparin  + warfarin Indication: mAVR, CVA  Allergies[1]  Patient Measurements: Height: 5' 4 (162.6 cm) Weight: 47.7 kg (105 lb 2.6 oz) IBW/kg (Calculated) : 54.7 HEPARIN  DW (KG): 45.4  Vital Signs: Temp: 98.9 F (37.2 C) (01/03 0347) Temp Source: Oral (01/03 0347) BP: 112/63 (01/03 0347) Pulse Rate: 68 (01/03 0347)  Labs: Recent Labs    05/25/24 0135 05/26/24 0235 05/27/24 0412  HGB 12.4 10.9* 11.2*  HCT 37.9 34.0* 34.2*  PLT 262 209 204  LABPROT 21.3* 24.5* 27.2*  INR 1.7* 2.1* 2.4*  HEPARINUNFRC 0.39 0.40 0.41  CREATININE 0.93  --   --     Estimated Creatinine Clearance: 72.1 mL/min (by C-G formula based on SCr of 0.93 mg/dL).  Assessment: 78 YOF presenting with TIA, hx of aortic valve replacement on warfarin PTA, INR subtherapeutic at 1.0 on admission and to start heparin  gtt, avoid bolus for now with current stroke workup. MRI showed an acute 7mm infarct.   PTA warfarin regimen - 7.5 mg daily. Of note, pt has home INR machine  05/27/24: heparin  level 0.41, therapeutic at 600 units/hr. INR remains slightly subtherapeutic today at 2.4 but is trending up to goal. No bleeding noted, Hgb up 11.2, platelets stable.  Goal of Therapy:  Heparin  level 0.3-0.5 units/ml INR 2.5-3.5 Monitor platelets by anticoagulation protocol: Yes   Plan:  Continue heparin  gtt at 600 units/hr - consider d/c when INR >2.5 Warfarin 7.5 mg PO x1 today Heparin  level, CBC and INR daily Monitor for s/sx of bleeding  Thank you for involving pharmacy in this patient's care.  R. Samual Satterfield, PharmD PGY-1 Acute Care Pharmacy Resident [facility] Please refer to [facility] for [facility] Pharmacy numbers 05/27/2024 7:20 AM           [1] No Known Allergies

## 2024-05-27 NOTE — Plan of Care (Signed)
" °  Problem: Ischemic Stroke/TIA Tissue Perfusion: Goal: Complications of ischemic stroke/TIA will be minimized Outcome: Progressing   Problem: Health Behavior/Discharge Planning: Goal: Ability to manage health-related needs will improve Outcome: Progressing   Problem: Nutrition: Goal: Adequate nutrition will be maintained Outcome: Progressing   Problem: Elimination: Goal: Will not experience complications related to bowel motility Outcome: Progressing   Problem: Safety: Goal: Ability to remain free from injury will improve Outcome: Progressing   "

## 2024-05-27 NOTE — Progress Notes (Signed)
 Discharged to home accompanied by nurse tech and family.

## 2024-05-27 NOTE — Discharge Summary (Addendum)
 Stroke Discharge Summary  Patient ID: Peggy Bennett   MRN: [id number]      DOB: [birth year]  Date of Admission: 05/21/2024 Date of Discharge: 05/27/2024  Attending Physician:  Stroke, Md, MD Patient's PCP:  Peggy Lynwood HERO, NP  DISCHARGE DIAGNOSIS:  Transient right facial droop and slurred speech likely due to small left MCA branch cerebral infarction Etiology: Left MCA  near complete occlusion likely due to embolism from mechanical heart valve with suboptimal anticoagulation Principal Problem:   Acute ischemic left MCA stroke [facility]) Active Problems:   Iron deficiency anemia   ICD (implantable cardioverter-defibrillator) in place   History of prosthetic aortic valve   History of surgery to heart and great vessels   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   aspirin  81 MG chewable tablet Chew 1 tablet (81 mg total) by mouth daily.   buPROPion  300 MG 24 hr tablet Commonly known as: WELLBUTRIN  XL Take 300 mg by mouth daily. What changed: Another medication with the same name was removed. Continue taking this medication, and follow the directions you see here.   sertraline  50 MG tablet Commonly known as: ZOLOFT  Take 50 mg by mouth daily.   sotalol  80 MG tablet Commonly known as: BETAPACE  Take 0.5 tablets (40 mg total) by mouth 2 (two) times daily.   warfarin 7.5 MG tablet Commonly known as: COUMADIN  Take 1 tablet (7.5 mg total) by mouth daily.        LABORATORY STUDIES CBC    Component Value Date/Time   WBC 3.5 (L) 05/27/2024 0412   RBC 4.10 05/27/2024 0412   HGB 11.2 (L) 05/27/2024 0412   HCT 34.2 (L) 05/27/2024 0412   PLT 204 05/27/2024 0412   MCV 83.4 05/27/2024 0412   MCH 27.3 05/27/2024  0412   MCHC 32.7 05/27/2024 0412   RDW 14.9 05/27/2024 0412   LYMPHSABS 0.6 (L) 10/18/2020 0623   MONOABS 0.5 10/18/2020 0623   EOSABS 0.1 10/18/2020 0623   BASOSABS 0.0 10/18/2020 0623   CMP    Component Value Date/Time   NA 134 (L) 05/25/2024 0135   K 4.1 05/25/2024 0135   CL 102 05/25/2024 0135   CO2 20 (L) 05/25/2024 0135   GLUCOSE 95 05/25/2024 0135   BUN 18 05/25/2024 0135   CREATININE 0.93 05/25/2024 0135   CALCIUM 9.5 05/25/2024 0135   PROT 6.8 10/07/2023 0950   ALBUMIN 4.3 10/07/2023 0950   AST 30 10/07/2023 0950   ALT 16 10/07/2023 0950   ALKPHOS 38 10/07/2023 0950   BILITOT 1.0 10/07/2023 0950   GFRNONAA >60 05/25/2024 0135   GFRAA NOT CALCULATED 08/21/2019 1629   COAGS Lab Results  Component Value Date   INR 2.4 (H) 05/27/2024   INR 2.1 (H) 05/26/2024   INR 1.7 (H) 05/25/2024   Lipid Panel    Component Value Date/Time   CHOL 164 05/22/2024 0741   TRIG 51 05/22/2024 0741   HDL 65 05/22/2024 0741   CHOLHDL 2.5 05/22/2024 0741   VLDL 10 05/22/2024 0741   LDLCALC 89 05/22/2024 0741   HgbA1C  Lab Results  Component Value Date   HGBA1C 5.0 05/21/2024  Urinalysis    Component Value Date/Time   COLORURINE YELLOW 10/07/2023 0950   APPEARANCEUR HAZY (A) 10/07/2023 0950   LABSPEC 1.021 10/07/2023 0950   PHURINE 5.0 10/07/2023 0950   GLUCOSEU NEGATIVE 10/07/2023 0950   HGBUR MODERATE (A) 10/07/2023 0950   BILIRUBINUR negative 11/11/2023 1747   KETONESUR negative 11/11/2023 1747   KETONESUR NEGATIVE 10/07/2023 0950   PROTEINUR negative 11/11/2023 1747   PROTEINUR 100 (A) 10/07/2023 0950   UROBILINOGEN 0.2 11/11/2023 1747   NITRITE Negative 11/11/2023 1747   NITRITE NEGATIVE 10/07/2023 0950   LEUKOCYTESUR Negative 11/11/2023 1747   LEUKOCYTESUR NEGATIVE 10/07/2023 0950   Urine Drug Screen No results found for: LABOPIA, COCAINSCRNUR, LABBENZ, AMPHETMU, THCU, LABBARB  Alcohol Level No results found for: [facility]   SIGNIFICANT DIAGNOSTIC  STUDIES VAS US  TRANSCRANIAL DOPPLER W BUBBLES Result Date: 05/23/2024  Transcranial Doppler with Bubble Patient Name:  Peggy Bennett  Date of Exam:   05/22/2024 Medical Rec #: [id number]     Accession #:    [id number] Date of Birth: 2000-11-24     Patient Gender: F Patient Age:   23 years Exam Location:  [facility] Procedure:      VAS US  TRANSCRANIAL DOPPLER W BUBBLES Referring Phys: Pheobe Sandiford --------------------------------------------------------------------------------  Indications: Stroke. Comparison Study: No previous exams Performing Technologist: Jody Hill RVT, RDMS  Examination Guidelines: A complete evaluation includes B-mode imaging, spectral Doppler, color Doppler, and power Doppler as needed of all accessible portions of each vessel. Bilateral testing is considered an integral part of a complete examination. Limited examinations for reoccurring indications may be performed as noted.  Summary:  A vascular evaluation was performed. The left middle cerebral artery was studied. An IV was inserted into the patient's right forearm. Verbal informed consent was obtained.  At rest less than 10 HITS seen indicating a Spencer Grade 1 PFO. With valsalva 11-30 HITS seen indicating a Spencer Grade 2 PFO. Positive TCD Bubble study indicative of a small residual right to left shunt following prior VSD closure *See table(s) above for TCD measurements and observations.  Diagnosing physician: Eather Popp MD Electronically signed by Eather Popp MD on 05/23/2024 at 11:37:09 AM.    Final    MR BRAIN WO CONTRAST Result Date: 05/22/2024 EXAM: MRI BRAIN WITHOUT CONTRAST 05/22/2024 04:58:00 PM TECHNIQUE: Multiplanar multisequence MRI of the head/brain was performed without the administration of intravenous contrast. COMPARISON: CT head and CTA head and neck 05/21/2024. CLINICAL HISTORY: Neuro deficit, acute, stroke suspected. FINDINGS: BRAIN AND VENTRICLES: There is a 7 mm focus of acute infarct in the left  subinsular white matter. Infarct in the posterior aspect of the right basal ganglia involving the lentiform nucleus and partially involving the posterior limb of the internal capsule with surrounding gliosis. Gliosis also partially extends into the right corona radiata. No acute intracranial hemorrhage. There are multiple scattered chronic microhemorrhages in the cerebral hemispheres, particularly in the left frontal lobe as well as in the inferior aspects of the occipital lobes. Additional small chronic microhemorrhages in the cerebellum. No mass. No midline shift. No hydrocephalus. The sella is unremarkable. Normal flow voids. ORBITS: No acute abnormality. SINUSES AND MASTOIDS: No acute abnormality. BONES AND SOFT TISSUES: Normal marrow signal. No acute soft tissue abnormality. IMPRESSION: 1. Acute 7 mm infarct in the left subinsular white matter. 2. Chronic infarct in the posterior right basal ganglia. 3. Multiple scattered chronic cerebral and cerebellar microhemorrhages. Electronically signed by: Donnice Mania MD 05/22/2024 06:42 PM EST RP Workstation: [id number]   DG  Chest Port 1 View Result Date: 05/22/2024 EXAM: 1 VIEW(S) XRAY OF THE CHEST 05/22/2024 09:02:12 AM COMPARISON: 10/04/2003 CLINICAL HISTORY: Neuro deficit, acute, stroke suspected; 810434 Pacemaker [phone] FINDINGS: LINES, TUBES AND DEVICES: Left chest wall dual-lead pacemaker/AICD. LUNGS AND PLEURA: No focal pulmonary opacity. No pleural effusion. No pneumothorax. HEART AND MEDIASTINUM: Sequelae of prior cardiac valve replacement. Unchanged cardiomegaly. BONES AND SOFT TISSUES: Intact sternotomy sutures. Mild thoracic dextroscoliosis. No acute osseous abnormality. IMPRESSION: 1. No acute airspace disease. Unchanged cardiomegaly. Electronically signed by: Michaeline Blanch MD 05/22/2024 09:47 AM EST RP Workstation: [id number]   CT Angio Head Neck W WO CM Result Date: 05/21/2024 EXAM: CTA HEAD AND NECK WITH AND WITHOUT 05/21/2024 10:15:58 PM  TECHNIQUE: CTA of the head and neck was performed with and without the administration of intravenous contrast. Multiplanar 2D and/or 3D reformatted images are provided for review. Automated exposure control, iterative reconstruction, and/or weight based adjustment of the mA/kV was utilized to reduce the radiation dose to as low as reasonably achievable. Stenosis of the internal carotid arteries measured using NASCET criteria. COMPARISON: None available CLINICAL HISTORY: Neuro deficit, acute, stroke suspected Neuro deficit, acute, stroke suspected FINDINGS: AORTIC ARCH AND ARCH VESSELS: No dissection or arterial injury. No significant stenosis of the brachiocephalic or subclavian arteries. CERVICAL CAROTID ARTERIES: No dissection, arterial injury, or hemodynamically significant stenosis by NASCET criteria. CERVICAL VERTEBRAL ARTERIES: No visible dissection, arterial injury, or significant stenosis. Limited assessment of the left vertebral artery due to extensive streak artifact from refluxed venous contrast . LUNGS AND MEDIASTINUM: Unremarkable. SOFT TISSUES: No acute abnormality. BONES: No acute abnormality. ANTERIOR CIRCULATION: No significant stenosis of the internal carotid arteries. No significant stenosis of the anterior cerebral arteries. Bilateral M1 MCAs are patent. Right M2 MCA is patent without significant stenosis. Intraluminal thrombus within a proximal to mid left M2 MCA branch with resulting severe steno No aneurysm. POSTERIOR CIRCULATION: No significant stenosis of the posterior cerebral arteries. No significant stenosis of the basilar artery. No significant stenosis of the vertebral arteries. No aneurysm. OTHER: No dural venous sinus thrombosis on this non-dedicated study. IMPRESSION: 1. 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 Findings discussed with Dr. Guillermina via telephone at 10:29 PM. Electronically signed by: Gilmore Molt 05/21/2024 10:30 PM EST RP Workstation: [id number]   CT HEAD WO CONTRAST ( ) Result Date: 05/21/2024 EXAM: CT HEAD WITHOUT CONTRAST 05/21/2024 09:03:33 PM TECHNIQUE: CT of the head was performed without the administration of intravenous contrast. Automated exposure control, iterative reconstruction, and/or weight based adjustment of the mA/kV was utilized to reduce the radiation dose to as low as reasonably achievable. COMPARISON: Comparison made with a head CT from 08/21/2019. CLINICAL HISTORY: Neuro deficit, acute, stroke suspected. Slurred speech with facial droop earlier tonight, now resolved. FINDINGS: BRAIN AND VENTRICLES: No acute hemorrhage. There is a subcentimeter chronic-appearing lacunar infarct in the genu of the left internal capsule. Approximately 1.2 cm chronic-appearing lacunar infarct posteriorly in the underlying lentiform nucleus. Both are new from 2021 but do not appear recent. There is mild cortical volume loss in the cerebral hemispheres, but it is greater than expected for age. No cortical based acute infarct, hemorrhage, or mass is seen. Cerebellum and brainstem are unremarkable. No hydrocephalus. No extra-axial collection. No mass effect or midline shift. ORBITS: No acute abnormality. SINUSES: No acute abnormality. SOFT TISSUES AND SKULL: No acute soft tissue abnormality. No skull fracture. IMPRESSION: 1. No acute intracranial CT findings. 2. Chronic-appearing lacunar infarcts in  the genu of the left internal capsule and in the posterior lentiform nucleus, new from 2021 but not appearing recent. 3. Mild cortical volume loss, greater than expected for age. Electronically signed by: Francis Quam MD 05/21/2024 10:02 PM EST RP Workstation: [id number]      HISTORY OF PRESENT ILLNESS 24 y.o. 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 due to 2-minute episode of right facial droop and word finding difficulty with slurred speech, INR 1.  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  Admitted for continued close monitoring in neuro ICU and started on heparin  drip.  NIH on Admission 0. Continued on Heparin , Warfarin and Aspirin  while inpateint bridging INR to goal level of 2.5-3.5.    HOSPITAL COURSE Transient right facial droop and slurred speech likely due to small left MCA branch cerebral infarction Etiology: Left MCA  near complete occlusion likely due to embolism from mechanical heart valve with suboptimal anticoagulation  CT head  No acute intracranial CT findings. Chronic-appearing lacunar infarcts in the genu of the left internal capsule and in the posterior lentiform nucleus, new from 2021 but not appearing recent. Mild cortical volume loss, greater than expected for age. CTA head & neck  Intraluminal thrombus within a proximal to mid left M2 MCA branch with resulting severe stenosis (nearly occlusive) MRI  Acute 7 mm infarct left subinsular white matter Chronic infarct posterior right basal ganglia Multiple scattered chronic cerebral and cerebellar microhemorrhages 2D Echo: no need to repeat during this admission due to previous echo completed in July 12/21/2023 [facility]) S/p AV replacement/truncus arteriosus repair Normal size LA, RA.  EF 79%. TCD with bubble study:  At rest less than 10 HITS seen indicating a Spencer Grade 1 PFO. With valsalva 11-30 HITS seen indicating a Spencer Grade 2 PFO. Positive TCD Bubble study indicative of a small residual right to left shunt following prior VSD closure  LDL 89 HgbA1c 5.0 warfarin daily prior to admission, Continue anticoagulation with Warfarin and ASA on discharge Continue daily INR checks, per established home protocol Therapy recommendations: No follow-ups  recommended. Disposition: discharge to home  Hx of Stroke/TIA History of R internal capsule and R basal ganglia stroke 08/21/2019 with residual left-sided motor deficits, significantly improved.    S/p AV replacement S/p Truncus Arteriosus repair Hx of v tach Home Meds: Warfarin 7.5mg  Daily, sotalol  80mg  BID continued INR: 1.0--1.3--1.4-1.6-1.7-2.1-2.4 Goal INR 2.5-3.5 INR was noted to be 11.0 7 days ago.  Patient was told to stop her Coumadin , given vitamin K.  She had not restarted her Coumadin . Per patient, she checks her INR independently with strips, at home. Strips will be sent to her home on discharge.  Outpatient lab check scheduled for Monday in case strips do not arrive in time. Continue anticoagulation with Warfarin and ASA on discharge Continue daily INR checks, per established home protocol   Hyperlipidemia Home meds:  none LDL 89, goal < 70 No statin due to possibility of interaction with Coumadin   DISCHARGE EXAM Blood pressure 106/69, pulse 82, temperature 98.7 F (37.1 C), resp. rate 16, height 5' 4 (1.626 m), weight 47.7 kg, last menstrual period 05/08/2024, SpO2 100%.  PHYSICAL EXAM General:  Alert, well-nourished, well-developed patient in no acute distress Psych:  Mood and affect appropriate for situation CV: Regular  rate and rhythm on monitor Respiratory:  Regular, unlabored respirations on room air GI: Abdomen soft and nontender   NEURO:  Mental Status: AA&Ox3, patient is able to give clear and coherent history Speech/Language: speech is without  aphasia.  Naming, repetition, fluency, and comprehension intact.   Cranial Nerves:  II: PERRL. Visual fields full.  III, IV, VI: EOMI. Eyelids elevate symmetrically.  V: Sensation is intact to light touch and symmetrical to face.  VII: Face is symmetrical resting and smiling VIII: hearing intact to voice. IX, X: Palate elevates symmetrically.  No dysarthria KP:Dynloizm shrug 5/5. XII: tongue is  midline Motor: 5/5 strength to all muscle groups tested.  Tone: is normal and bulk is normal Sensation- Intact to light touch bilaterally.  Coordination: FTN intact bilaterally, HKS: no ataxia in BLE.Slight decrease in fine motor movements on left, improved.  Gait- deferred   Most Recent NIH 0.  Discharge Diet       Diet   Diet Heart Room service appropriate? Yes; Fluid consistency: Thin   liquids  DISCHARGE PLAN Disposition:  home aspirin  81 mg daily and warfarin daily for secondary stroke prevention  Continue daily INR checks at home, reporting to same  cardiologist at [facility]. No changes to established INR checking protocol that is established.  Ongoing stroke risk factor control by Primary Care Physician at time of discharge Follow-up PCP Peggy Lynwood HERO, NP in 2 weeks. External referral for Neuro psych testing, per family request.   35 minutes were spent preparing discharge.  Rocky JAYSON Likes, DNP [facility]  I have personally obtained history,examined this patient, reviewed notes, independently viewed imaging studies, participated in medical decision making and plan of care.ROS completed by me personally and pertinent positives fully documented  I have made any additions or clarifications directly to the above note. Agree with note above.    Eather Popp, MD Medical Director [facility] Pager: [phone] 05/27/2024 1:29 PM

## 2024-05-29 ENCOUNTER — Telehealth: Payer: Self-pay

## 2024-05-29 NOTE — Patient Instructions (Signed)
 Visit Information  Thank you for taking time to visit with me today. Please don't hesitate to contact me if I can be of assistance to you.  Patient instructions:  Take medications as prescribed Schedule a 1-2 week hospital follow up visit with your primary care provider. Continue to follow your heart healthy diet Maintain good fluid intake Signs of recurrent stroke:  sudden weakness, facial droop, speech difficulty, confusion, loss of balance, severe headache.  Seek emergency medical services for these symptoms Contact your provider for any new or ongoing symptoms.    Patient verbalizes understanding of instructions and care plan provided today and agrees to view in MyChart. Active MyChart status and patient understanding of how to access instructions and care plan via MyChart confirmed with patient.     The patient has been provided with contact information for the care management team and has been advised to call with any health related questions or concerns.   Please call the care guide team at [phone] if you need to cancel or reschedule your appointment.   Please call the Suicide and Crisis Lifeline: 988 call the [place]  National Suicide Prevention Lifeline: 281-611-4040 or TTY: (904)781-7580 TTY 3320866417) to talk to a trained counselor call 1-800-273-TALK (toll free, 24 hour hotline) if you are experiencing a Mental Health or Behavioral Health Crisis or need someone to talk to.  Arvin Seip RN, BSN, CCM [employer], Population Health Case Manager Phone: [phone]

## 2024-05-29 NOTE — Transitions of Care (Post Inpatient/ED Visit) (Signed)
 "  05/29/2024  Name: Peggy Bennett MRN: [id number] DOB: Aug 17, 2000  Today's TOC FU Call Status: Today's TOC FU Call Status:: Successful TOC FU Call Completed Unsuccessful Call (1st Attempt) Date: 05/29/24 [facility] FU Call Complete Date: 05/29/24  Patient's Name and Date of Birth confirmed. Name, DOB  Transition Care Management Follow-up Telephone Call Date of Discharge: 05/27/24 Discharge Facility: [facility]) Type of Discharge: Inpatient Admission Primary Inpatient Discharge Diagnosis:: acute ischemic left MCA stroke How have you been since you were released from the hospital?: Better Any questions or concerns?: No  Items Reviewed: Did you receive and understand the discharge instructions provided?: Yes Medications obtained,verified, and reconciled?: Yes (Medications Reviewed) Any new allergies since your discharge?: No Dietary orders reviewed?: Yes Type of Diet Ordered:: low salt heart healthy Do you have support at home?: Yes People in Home [RPT]: parent(s) Name of Support/Comfort Primary Source: Adrienne Beneke  Medications Reviewed Today: Medications Reviewed Today     Reviewed by Delrose Rohwer E, RN (Registered Nurse) on 05/29/24 at 1016  Med List Status: <None>   Medication Order Taking? Sig Documenting Provider Last Dose Status Informant  acetaminophen  (TYLENOL ) 500 MG tablet [id number] Yes Take 1,000 mg by mouth every 6 (six) hours as needed for mild pain (pain score 1-3) or headache. [provider]  Active Self, Pharmacy Records  aspirin  81 MG chewable tablet [id number] Yes Chew 1 tablet (81 mg total) by mouth daily. Lehner, Erin C, NP  Active   buPROPion  (WELLBUTRIN  XL) 300 MG 24 hr tablet [id number] Yes Take 300 mg by mouth daily. [provider]  Active Self, Pharmacy Records  sertraline  (ZOLOFT ) 50 MG tablet [id number] Yes Take 50 mg by mouth daily. [provider]  Active Self, Pharmacy Records  sotalol  (BETAPACE ) 80 MG tablet [id number] Yes Take  0.5 tablets (40 mg total) by mouth 2 (two) times daily. Lehner, Erin C, NP  Active   warfarin (COUMADIN ) 7.5 MG tablet [id number] Yes Take 1 tablet (7.5 mg total) by mouth daily. Judithe Rocky BROCKS, NP  Active             Home Care and Equipment/Supplies: Were Home Health Services Ordered?: No Any new equipment or medical supplies ordered?: No  Functional Questionnaire: Do you need assistance with bathing/showering or dressing?: No Do you need assistance with meal preparation?: No Do you need assistance with eating?: No Do you have difficulty maintaining continence: No Do you need assistance with getting out of bed/getting out of a chair/moving?: No Do you have difficulty managing or taking your medications?: No  Follow up appointments reviewed: PCP Follow-up appointment confirmed?: No (patient states she will call and scheduled a hospital follow up visit with her primary care provider.) Specialist Hospital Follow-up appointment confirmed?: Yes Date of Specialist follow-up appointment?: 06/09/24 Follow-Up Specialty Provider:: Dr. Ozell Alba Do you need transportation to your follow-up appointment?: No Do you understand care options if your condition(s) worsen?: Yes-patient verbalized understanding  SDOH Interventions Today    Flowsheet Row Most Recent Value  SDOH Interventions   Food Insecurity Interventions Intervention Not Indicated  Housing Interventions Intervention Not Indicated  Transportation Interventions Intervention Not Indicated  Utilities Interventions Intervention Not Indicated   Discussed and offered 30 day TOC program.  Patient declined services.   The patient has been provided with contact information for the care management team and has been advised to call with any health -related questions or concerns.  The patient verbalized understanding with current plan of care.  The  patient is directed to their insurance card regarding availability of benefits coverage.     Arvin Seip RN, BSN, CCM [employer], Population Health Case Manager Phone: [phone]  "

## 2024-06-05 ENCOUNTER — Telehealth: Payer: Self-pay

## 2024-06-05 NOTE — Telephone Encounter (Signed)
 I spoke with Peggy Bennett;Peggy Bennett said she feeling better today. No lightheadedness today. Peggy Bennett took BP and now BP 112/76. Peggy Bennett has no H/A, dizziness, CP,SOB or vision changes.Menstrual period is very light today. Peggy Bennett has not gone to UC or ED. Peggy Bennett said she wants to wait for appt already scheduled with Adina Crandall NP on 06/06/24 at 11 AM. UC & ED precautions given and Peggy Bennett voiced understanding. Sending note to CHRISTELLA Crandall NP and [employer].

## 2024-06-05 NOTE — Telephone Encounter (Signed)
 Peggy Bennett

## 2024-06-05 NOTE — Telephone Encounter (Signed)
 Noted. Will evaluate in office

## 2024-06-06 ENCOUNTER — Telehealth: Payer: Self-pay | Admitting: Nurse Practitioner

## 2024-06-06 ENCOUNTER — Ambulatory Visit: Admitting: Nurse Practitioner

## 2024-06-06 VITALS — BP 90/60 | HR 65 | Temp 97.5°F | Ht 64.0 in | Wt 104.2 lb

## 2024-06-06 DIAGNOSIS — Z8673 Personal history of transient ischemic attack (TIA), and cerebral infarction without residual deficits: Secondary | ICD-10-CM | POA: Diagnosis not present

## 2024-06-06 DIAGNOSIS — R636 Underweight: Secondary | ICD-10-CM | POA: Diagnosis not present

## 2024-06-06 DIAGNOSIS — I359 Nonrheumatic aortic valve disorder, unspecified: Secondary | ICD-10-CM

## 2024-06-06 DIAGNOSIS — F419 Anxiety disorder, unspecified: Secondary | ICD-10-CM

## 2024-06-06 MED ORDER — SERTRALINE HCL 50 MG PO TABS: 50.0000 mg | ORAL_TABLET | Freq: Every day | ORAL | 1 refills | Status: AC

## 2024-06-06 NOTE — Telephone Encounter (Signed)
 Can we call patinet cardiologist and see if they are ok with hydroxyzine 10mg  TID prn with the patients Current QTC

## 2024-06-06 NOTE — Progress Notes (Signed)
 "  Established Patient Office Visit  Subjective   Patient ID: Peggy Bennett, female    DOB: 2000/09/22  Age: 24 y.o. MRN: [id number]  Chief Complaint  Patient presents with   Hospitalization Follow-up    Pt complains of doing good since ER visit.     HPI  Patient with a hospital 05/21/2024 for an episode of slurred speech and facial droop at home.  Patient had been off of her warfarin for the last 5 days but restarted today before coming to the emergency department.  She endorses a small headache.  Patient had CT angio that showed an occlusion of M2 MCA with intramural thrombosis.  Patient was not a candidate for thrombectomy.  Patient was discharged on 05/27/2024.  She is to continue aspirin  81 g daily and warfarin.  We requested a referral to neuropsychiatry per family request.  She is here for follow-up  Discussed the use of AI scribe software for clinical note transcription with the patient, who gave verbal consent to proceed.  History of Present Illness Peggy Bennett is a 24 year old female with a history of stroke who presents for follow-up after a recent ER visit for neurological symptoms. She is accompanied by her mother.  On December 28th, she experienced slurred speech and facial droop, prompting a hospital visit. Initial CT scan of the head was unremarkable, but further imaging revealed a blockage in the brain. She was diagnosed with a stroke. She was admitted and discharged on January 3rd.  On January 10th, she felt hot, nauseous, and experienced dry heaving while in the shower, leading to an ER visit on January 12th. A CT scan and blood work showed no new findings, and she was discharged. She reports no fever, chills, chest pain, or shortness of breath. She experienced lightheadedness on Sunday night but has felt okay since.  She is currently following up with the Coumadin  clinic due to issues with INR levels, which were initially high, leading to a temporary cessation of Coumadin   and resulting in subtherapeutic levels. She is scheduled to see her cardiologist on Friday.  She is taking sertraline  50 mg daily and is concerned about her prescription running out. She expressed interest in cognitive testing due to her history of strokes.  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 She has been losing weight and is concerned about her nutrition, reporting a low appetite, eating one to two meals a day, and snacking on chips and fruit. Her weight has been stable recently.     Review of Systems  Constitutional:  Negative for chills and fever.  Respiratory:  Negative for shortness of breath.   Cardiovascular:  Negative for chest pain.  Neurological:  Negative for dizziness and headaches.  Psychiatric/Behavioral:  Negative for hallucinations and suicidal ideas.       Objective:     BP 90/60   Pulse 65   Temp (!) 97.5 F (36.4 C) (Oral)   Ht 5' 4 (1.626 m)   Wt 104 lb 3.2 oz (47.3 kg)   LMP 05/08/2024   SpO2 99%   BMI 17.89 kg/m  BP Readings from Last 3 Encounters:  06/06/24 90/60  05/27/24 105/66  05/15/24 96/78   Wt Readings from Last 3 Encounters:  06/06/24 104 lb 3.2 oz (47.3 kg)  05/24/24 105 lb 2.6 oz (47.7 kg)  12/16/23 102 lb 12.8 oz (46.6 kg)  SpO2 Readings from Last 3 Encounters:  06/06/24 99%  05/27/24 100%  05/15/24 100%      Physical Exam Vitals and nursing note reviewed.  Constitutional:      Appearance: Normal appearance.  HENT:     Mouth/Throat:     Mouth: Mucous membranes are moist.     Pharynx: Oropharynx is clear.  Eyes:     Extraocular Movements: Extraocular movements intact.     Pupils: Pupils are equal, round, and reactive to light.  Cardiovascular:     Rate and Rhythm: Normal rate and regular rhythm.     Heart sounds: Murmur heard.  Pulmonary:     Effort: Pulmonary effort is normal.     Breath sounds: Normal breath sounds.   Neurological:     Mental Status: She is alert. Mental status is at baseline.     Cranial Nerves: Cranial nerves 2-12 are intact.     Motor: Motor function is intact.     Gait: Gait is intact.     Deep Tendon Reflexes:     Reflex Scores:      Bicep reflexes are 2+ on the right side and 3+ on the left side.      Patellar reflexes are 2+ on the right side and 3+ on the left side.    Comments: Bilateral upper and lower extremity strength 5/5      No results found for any visits on 06/06/24.    The ASCVD Risk score (Arnett DK, et al., 2019) failed to calculate for the following reasons:   The 2019 ASCVD risk score is only valid for ages 93 to 23   Risk score cannot be calculated because patient has a medical history suggesting prior/existing ASCVD   * - Cholesterol units were assumed    Assessment & Plan:   Problem List Items Addressed This Visit       Cardiovascular and Mediastinum   Aortic valve disorder     Other   History of stroke   Relevant Orders   Ambulatory referral to Neurology   Other Visit Diagnoses       Anxiety    -  Primary   Relevant Medications   sertraline  (ZOLOFT ) 50 MG tablet     Underweight       Relevant Orders   Referral to Nutrition and Diabetes Services      Assessment and Plan Assessment & Plan Cerebral infarction Recent stroke with transient symptoms, no residual deficits. INR management issues noted. No current neurologist follow-up. - Referred to [facility] for stroke management and follow-up. - Continue follow-up with cardiology for INR management. - Arrange neuropsychological evaluation for cognitive assessment post-stroke. - Reviewed ed, inpatient discharge note, imaging and most recent labs   Long QT syndrome Managed with sotalol . Hydroxyzine discussed for anxiety with minimal QTc risk. - consider hydroxyzine. Will consult with patients cardiologist since her QTc is 499 - Continue sotalol  for QTc management.  Depressive  disorder On sertraline  50 mg daily. - Refilled sertraline  50 mg daily.  Anxiety disorder Hydroxyzine suggested as a safer alternative to benzodiazepines. - consider hydroxyzine for anxiety management, up to three times daily as needed, pending cardiology approval  Underweight Weight stable at 104 lbs with decreased appetite. Discussed dietary habits and potential nutritional deficiencies. - Referred to nutritionist for dietary assessment and management. - Encouraged increased fluid intake to 64 ounces daily. - Follow up in three months to reassess weight and nutritional status.   Return in about 3 months (  around 09/04/2024) for weight/mood.    Adina Crandall, NP  "

## 2024-06-06 NOTE — Telephone Encounter (Signed)
 Do we have someone in the group that does neuropysch evals?

## 2024-06-06 NOTE — Patient Instructions (Signed)
 Nice to see you today I want to see you in 3 months, sooner if you need me

## 2024-06-07 NOTE — Telephone Encounter (Addendum)
 Left voicemail for Cardiologist nurse to call the office back.

## 2024-06-12 NOTE — Telephone Encounter (Signed)
 Cardiologist office is closed today. Will call back tomorrow.

## 2024-06-13 NOTE — Telephone Encounter (Signed)
 S-INR 1.6 B-On Warfarin 7.5mg  tab, daily ASA 81mg  daily. Hx Mechanical Valve, ICD, stroke. Denies any sxs. Pt states 06/09/24, took Warfarin in am vs pm, in error. Pt held 06/09/24 pm dose of Warfarin and resumed  Warfarin 7.5mg  tab daily, taken at night, on 06/10/24, as ordered. A-Call PCP R-will page on-call  Upcoming Appt: Future Appointments  Date Time Provider Department Center  07/10/2024  9:00 AM Rosman, Morna Dragon, PhD [facility]  11/22/2024  2:50 PM Mulo, [place], DO [facility]  12/08/2024 10:30 AM Pia Sharper, MD [facility]    Disposition: HL-Provider Paged (Second Level Evaluation), Call PCP Now  Triage documentation reviewed and Disposition present? yes  Encounter Reason for Disposition:   [1] Follow-up call from patient regarding patient's clinical status AND [2] information urgent  Is this a pediatric patient?  No   Any recent, relevant visit?  Yes   Date/location of recent visit? 05/21/24 Hospitalized due to lo INR  Any related medications? Yes   Name of medication and dose? warfarin (JANTOVEN ) 7.5 MG tablet  daily aspirin  (ECOTRIN) 81 MG tablet Take 1 tablet (81 mg total) by mouth daily.   Any relevant medical history?  Yes   What history? Stroke, Mechanical Valve, ICD  Any interventions?  No   [facility] Eligibility  Patient is eligible for [facility] (according to most recent zip code and insurance information)?  No  Patient is [facility] eligible and appropriate for referral: No   Encounter Initial Assessment: 1. REASON FOR CALL or QUESTION: What is your reason for calling today? or How can I best     INR is currently 1.6. Denies any sxs. 2. CALLER: Document the source of call. (e.g., laboratory staff, caregiver or patient).     pt   Encounter Protocols Used: PCP Call - No Triage-A-AH

## 2024-06-13 NOTE — Telephone Encounter (Signed)
 Left VM for Cardio clinic. Sent e message to the clinic with inquiry to medication management.

## 2024-06-14 ENCOUNTER — Other Ambulatory Visit: Payer: Self-pay

## 2024-06-14 ENCOUNTER — Emergency Department (HOSPITAL_COMMUNITY)
Admission: EM | Admit: 2024-06-14 | Discharge: 2024-06-14 | Disposition: A | Discharge status: Home / Self Care | Attending: Emergency Medicine | Admitting: Emergency Medicine

## 2024-06-14 ENCOUNTER — Encounter (HOSPITAL_COMMUNITY): Payer: Self-pay | Admitting: Emergency Medicine

## 2024-06-14 DIAGNOSIS — Z7901 Long term (current) use of anticoagulants: Secondary | ICD-10-CM | POA: Diagnosis not present

## 2024-06-14 DIAGNOSIS — R791 Abnormal coagulation profile: Secondary | ICD-10-CM | POA: Diagnosis present

## 2024-06-14 DIAGNOSIS — Z7982 Long term (current) use of aspirin: Secondary | ICD-10-CM | POA: Insufficient documentation

## 2024-06-14 LAB — PROTIME-INR
INR: 1.6 — ABNORMAL HIGH (ref 0.8–1.2)
Prothrombin Time: 19.8 s — ABNORMAL HIGH (ref 11.4–15.2)

## 2024-06-14 LAB — COMPREHENSIVE METABOLIC PANEL WITH GFR
ALT: 10 U/L (ref 0–44)
AST: 20 U/L (ref 15–41)
Albumin: 4.4 g/dL (ref 3.5–5.0)
Alkaline Phosphatase: 52 U/L (ref 38–126)
Anion gap: 8 (ref 5–15)
BUN: 13 mg/dL (ref 6–20)
CO2: 25 mmol/L (ref 22–32)
Calcium: 9.6 mg/dL (ref 8.9–10.3)
Chloride: 105 mmol/L (ref 98–111)
Creatinine, Ser: 0.75 mg/dL (ref 0.44–1.00)
GFR, Estimated: >60 mL/min
Glucose, Bld: 84 mg/dL (ref 70–99)
Potassium: 3.9 mmol/L (ref 3.5–5.1)
Sodium: 139 mmol/L (ref 135–145)
Total Bilirubin: 0.3 mg/dL (ref 0.0–1.2)
Total Protein: 6.9 g/dL (ref 6.5–8.1)

## 2024-06-14 LAB — CBC WITH DIFFERENTIAL/PLATELET
Abs Immature Granulocytes: 0.01 K/uL (ref 0.00–0.07)
Basophils Absolute: 0 K/uL (ref 0.0–0.1)
Basophils Relative: 0 %
Eosinophils Absolute: 0.1 K/uL (ref 0.0–0.5)
Eosinophils Relative: 2 %
HCT: 32.6 % — ABNORMAL LOW (ref 36.0–46.0)
Hemoglobin: 10.2 g/dL — ABNORMAL LOW (ref 12.0–15.0)
Immature Granulocytes: 0 %
Lymphocytes Relative: 18 %
Lymphs Abs: 0.9 K/uL (ref 0.7–4.0)
MCH: 26.4 pg (ref 26.0–34.0)
MCHC: 31.3 g/dL (ref 30.0–36.0)
MCV: 84.2 fL (ref 80.0–100.0)
Monocytes Absolute: 0.5 K/uL (ref 0.1–1.0)
Monocytes Relative: 10 %
Neutro Abs: 3.5 K/uL (ref 1.7–7.7)
Neutrophils Relative %: 70 %
Platelets: 240 K/uL (ref 150–400)
RBC: 3.87 MIL/uL (ref 3.87–5.11)
RDW: 15.1 % (ref 11.5–15.5)
WBC: 5.1 K/uL (ref 4.0–10.5)
nRBC: 0 % (ref 0.0–0.2)

## 2024-06-14 NOTE — ED Notes (Signed)
 Unable to obtain labs using ultrasound for straight stick. Having a 3rd RN try to obtain labs now.

## 2024-06-14 NOTE — ED Notes (Signed)
 Per MD us  US  to straight stick

## 2024-06-14 NOTE — Telephone Encounter (Signed)
 Contacted cardiology clinic and spoke with nurse Meade Bullock that she will speak with the doctor about the medication hydroxyzine.

## 2024-06-14 NOTE — ED Notes (Signed)
 Patient was ok receiving IV due to being hardstick and having 2 failed attempts one being with US  straight stick. Blood work has been sent off.

## 2024-06-14 NOTE — ED Provider Notes (Signed)
 " [place] EMERGENCY DEPARTMENT AT [facility] Provider Note  CSN: [id number] Arrival date & time: 06/14/24 0141  Chief Complaint(s) Abnormal Labs   History provided by patient and mother. HPI & MDM Peggy Bennett is a 24 y.o. 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  On Coumadin  here for subtherapeutic INR of 1.6 with her point-of-care INR machine at home.  Patient reports being compliant with her 7.5 mg dose daily.  Her last INR 1 week ago was 2.9.  Patient stated that over the weekend she had collard greens daily.  Denies any other physical complaints.  Patient was recently admitted for stroke in the setting of being subtherapeutic.  She denies any focal deficits currently.  HPI    Medical Decision Making Amount and/or Complexity of Data Reviewed Labs: ordered.    Subtherapeutic INR possibly related to dietary intake. INR of 1.6 was confirmed. Rest of the labs are reassuring. Patient last took her Coumadin  this evening. Patient states that she still has 1 mg Coumadin  tablets. Recommended increase dose from 7.5 to 8.5 mg over the next several days.  Recommend daily INR checks.  Recommended she reach out to her primary Coumadin  pharmacist for additional recommendations.   Final Clinical Impression(s) / ED Diagnoses Final diagnoses:  Subtherapeutic international normalized ratio (INR)   The patient appears reasonably screened and/or stabilized for discharge and I doubt any other medical condition or other [facility] requiring further screening, evaluation, or treatment in the ED at this time. I have discussed the findings, Dx and Tx plan with the patient/family who expressed understanding and agree(s) with the plan. Discharge instructions discussed at length. The patient/family was given strict return precautions who verbalized understanding of the instructions. No further questions at time of  discharge.  Disposition: Discharge  Condition: Good  ED Discharge Orders     None         Follow Up: Wendee Lynwood HERO, NP 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 Authorizing Provider  acetaminophen  (TYLENOL ) 500 MG tablet Take 1,000 mg by mouth every 6 (six) hours as needed for mild pain (pain score 1-3) or headache.    [provider]  aspirin  81 MG chewable tablet Chew 1 tablet (81 mg total) by mouth daily. 05/27/24   Lehner, Erin C, NP  buPROPion  (WELLBUTRIN  XL) 300 MG 24 hr tablet Take 300 mg by mouth daily. 09/16/23   [provider]  ondansetron  (ZOFRAN ) 4 MG tablet Take 4 mg by mouth. 11/11/23   [provider]  sertraline  (ZOLOFT ) 50 MG tablet Take 1 tablet (50 mg total) by mouth daily. 06/06/24   Wendee Lynwood HERO, NP  sotalol  (BETAPACE ) 80 MG tablet Take 0.5 tablets (40 mg total) by mouth 2 (two) times daily. 05/27/24   Lehner, Erin C, NP  warfarin (COUMADIN ) 7.5 MG  tablet Take 1 tablet (7.5 mg total) by mouth daily. 05/27/24   Lehner, Erin C, NP                                                                                                                                    Allergies Patient has no known allergies.  Review of Systems Review of Systems As noted in HPI  Physical Exam Vital Signs  I have reviewed the triage vital signs BP (!) 120/59   Pulse 71   Temp 98.1 F (36.7 C)   Resp 16   LMP 05/30/2024 (Approximate)   SpO2 100%   Physical Exam Vitals reviewed.  Constitutional:      General: She is not in acute distress.    Appearance: She is well-developed. She is not diaphoretic.  HENT:     Head: Normocephalic and atraumatic.     Right Ear: External ear normal.     Left Ear: External ear normal.     Nose: Nose normal.  Eyes:     General: No scleral icterus.    Conjunctiva/sclera: Conjunctivae normal.  Neck:     Trachea: Phonation normal.  Cardiovascular:     Rate and Rhythm: Normal rate and regular rhythm.  Pulmonary:     Effort: Pulmonary effort is normal. No respiratory distress.     Breath sounds: No stridor.  Abdominal:     General: There is no distension.  Musculoskeletal:        General: Normal range of motion.     Cervical back: Normal range of motion.  Neurological:     Mental Status: She is alert and oriented to person, place, and time.     Comments: Mental Status:  Alert and oriented to person, place, and time.  Attention and concentration normal.  Speech clear.  Recent memory is intact  Cranial Nerves:  II Visual Fields: Intact to confrontation. Visual fields intact. III, IV, VI: Pupils equal and reactive to light and near. Full eye movement without nystagmus  V Facial Sensation: Normal. No weakness of masticatory muscles  VII: No facial weakness or asymmetry  VIII Auditory Acuity: Grossly normal  IX/X: The uvula is midline; the palate elevates symmetrically  XI: Normal sternocleidomastoid and trapezius  strength  XII: The tongue is midline. No atrophy or fasciculations.   Motor System: Muscle Strength: 5/5 and symmetric in the upper and lower extremities. No pronation or drift.  Muscle Tone: Tone and muscle bulk are normal in the upper and lower extremities.  Coordination: Intact  finger-to-nose. No tremor.  Sensation: Intact to light touch Gait: Routine gait normal.   Psychiatric:        Behavior: Behavior normal.     ED Results and Treatments Labs (all labs ordered are listed, but only abnormal results are displayed) Labs Reviewed  CBC WITH DIFFERENTIAL/PLATELET - Abnormal; Notable for the following components:      Result Value   Hemoglobin 10.2 (*)    HCT 32.6 (*)    All other components within normal limits  PROTIME-INR - Abnormal; Notable for the following components:   Prothrombin Time 19.8 (*)    INR 1.6 (*)    All other components within normal limits  COMPREHENSIVE METABOLIC PANEL WITH GFR                                                                                                                         EKG  EKG Interpretation Date/Time:    Ventricular Rate:    PR Interval:    QRS Duration:    QT Interval:    QTC Calculation:   R Axis:      Text Interpretation:         Radiology No results found.  Medications Ordered in ED Medications - No data to display Procedures Procedures  (including critical care time)   This chart was dictated using voice recognition software.  Despite best efforts to proofread,  errors can occur which can change the documentation meaning.   Trine Raynell Moder, MD 06/14/24 0403  "

## 2024-06-14 NOTE — ED Notes (Signed)
 IV attempted unsuccessful. Patient is a known hard stick and requires US  IV in past. Will obtain USPIV.

## 2024-06-14 NOTE — Telephone Encounter (Signed)
 Copied from CRM #[id number]. Topic: Nurse Triage - [facility] Contract Page >> Jun 14, 2024 12:12 AM Darice Lemmings, RN wrote: Charge RN paged on-call provider, Rawleigh Standing MD, to discuss page and review SBAR.   Provider advised patient to proceed to ED. Additional instructions: n/a Consulting Civil Engineer returned call to patient/caregiver and reviewed provider's instructions as written above.  Patient/caregiver verbalized understanding. 2135- Dr. Rawleigh states he will do research and call back 2142-Called pt to update; denies any change from original triage. 9947- Pt will come to [facility] now. No further questions.

## 2024-06-14 NOTE — ED Triage Notes (Signed)
 Pt arrives w/ c/o abnormal INR reading. Reports result was 1.6. States she has hx of strokes & checks her INR levels at home. Denies any deficits from prior strokes. Reports taking warfarin daily.

## 2024-06-14 NOTE — Telephone Encounter (Signed)
 Notified by triage nurse that patient called in stating INR is 1.6.  Briefly, 45F with complex congential heart disease, including repaired truncus arteriosus and [employer] mechanical AVR (2012), for which she is on warfarin with goal INR 2.5-3.5. She was recently admitted in Dec 2025 for CVA which occurred when her INR was 1.0 (she had been instructed to take Vit K for INR 11.0 one week earlier).   I advised that she is in a high risk category because of her recent stroke, and she should come to the ED to be admitted for bridging anticoagulation until her INR stabilizes in the therapeutic range. Triage nurse to call patient back.  Elspeth JONELLE Percy, MD Fellow, Cardiovascular Medicine

## 2024-06-14 NOTE — Discharge Instructions (Addendum)
 Increase your dose of Coumadin  from 7.5 mg daily to 8.5 mg daily for the next 3 to 4 days.  Check your INR daily for the next week.  Touch base with your primary Coumadin  pharmacist to discuss any additional recommendations.

## 2024-06-16 NOTE — Telephone Encounter (Unsigned)
 Copied from CRM #[id number]. Topic: Clinical - Medication Question >> Jun 16, 2024 11:09 AM Delon DASEN wrote: Reason for CRM: Rock with [facility] cardiology- Dr Von does not recommend hydroxyzine due to being on sotalol - [phone]

## 2024-06-21 ENCOUNTER — Encounter: Payer: Self-pay | Admitting: Psychology

## 2024-08-30 ENCOUNTER — Ambulatory Visit: Payer: Self-pay

## 2024-08-30 ENCOUNTER — Institutional Professional Consult (permissible substitution): Payer: Self-pay | Admitting: Psychology

## 2024-09-05 ENCOUNTER — Ambulatory Visit: Admitting: Nurse Practitioner

## 2024-09-06 ENCOUNTER — Encounter: Payer: Self-pay | Admitting: Psychology

## 2024-12-18 ENCOUNTER — Encounter: Admitting: Nurse Practitioner
